# Patient Record
Sex: Female | Born: 2000 | Race: Black or African American | Hispanic: No | Marital: Single | Smoking: Never smoker
Health system: Southern US, Community
[De-identification: ages and names within clinical notes are randomized; demographics above are authoritative.]

## PROBLEM LIST (undated history)

## (undated) DIAGNOSIS — L309 Dermatitis, unspecified: Secondary | ICD-10-CM

## (undated) DIAGNOSIS — Z91018 Allergy to other foods: Secondary | ICD-10-CM

## (undated) DIAGNOSIS — T7840XA Allergy, unspecified, initial encounter: Secondary | ICD-10-CM

## (undated) DIAGNOSIS — G43909 Migraine, unspecified, not intractable, without status migrainosus: Secondary | ICD-10-CM

## (undated) DIAGNOSIS — R7303 Prediabetes: Secondary | ICD-10-CM

## (undated) DIAGNOSIS — J45909 Unspecified asthma, uncomplicated: Secondary | ICD-10-CM

## (undated) DIAGNOSIS — K219 Gastro-esophageal reflux disease without esophagitis: Secondary | ICD-10-CM

## (undated) DIAGNOSIS — Z91013 Allergy to seafood: Secondary | ICD-10-CM

## (undated) HISTORY — DX: Unspecified asthma, uncomplicated: J45.909

## (undated) HISTORY — DX: Dermatitis, unspecified: L30.9

## (undated) HISTORY — DX: Allergy to seafood: Z91.013

## (undated) HISTORY — DX: Allergy to other foods: Z91.018

## (undated) HISTORY — DX: Migraine, unspecified, not intractable, without status migrainosus: G43.909

## (undated) HISTORY — PX: NO PAST SURGERIES: SHX2092

## (undated) HISTORY — DX: Allergy, unspecified, initial encounter: T78.40XA

## (undated) HISTORY — DX: Gastro-esophageal reflux disease without esophagitis: K21.9

## (undated) HISTORY — DX: Prediabetes: R73.03

---
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
## 2022-01-08 ENCOUNTER — Ambulatory Visit: Payer: Medicaid Other | Admitting: Physician Assistant

## 2022-01-08 ENCOUNTER — Ambulatory Visit: Payer: Medicaid Other | Admitting: Family

## 2022-01-23 ENCOUNTER — Emergency Department (HOSPITAL_BASED_OUTPATIENT_CLINIC_OR_DEPARTMENT_OTHER)
Admission: EM | Admit: 2022-01-23 | Discharge: 2022-01-23 | Disposition: A | Discharge status: Home / Self Care | Payer: Medicaid Other | Attending: Emergency Medicine | Admitting: Emergency Medicine

## 2022-01-23 ENCOUNTER — Encounter (HOSPITAL_BASED_OUTPATIENT_CLINIC_OR_DEPARTMENT_OTHER): Payer: Self-pay | Admitting: Obstetrics and Gynecology

## 2022-01-23 ENCOUNTER — Other Ambulatory Visit: Payer: Self-pay

## 2022-01-23 DIAGNOSIS — M25531 Pain in right wrist: Secondary | ICD-10-CM | POA: Diagnosis not present

## 2022-01-23 DIAGNOSIS — Z7951 Long term (current) use of inhaled steroids: Secondary | ICD-10-CM | POA: Insufficient documentation

## 2022-01-23 DIAGNOSIS — J45909 Unspecified asthma, uncomplicated: Secondary | ICD-10-CM | POA: Insufficient documentation

## 2022-01-23 DIAGNOSIS — Y92410 Unspecified street and highway as the place of occurrence of the external cause: Secondary | ICD-10-CM | POA: Insufficient documentation

## 2022-01-23 DIAGNOSIS — S199XXA Unspecified injury of neck, initial encounter: Secondary | ICD-10-CM | POA: Diagnosis present

## 2022-01-23 DIAGNOSIS — S161XXA Strain of muscle, fascia and tendon at neck level, initial encounter: Secondary | ICD-10-CM | POA: Diagnosis not present
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
## 2022-01-23 NOTE — Discharge Instructions (Addendum)
You may use over-the-counter Motrin (Ibuprofen), Acetaminophen (Tylenol), topical muscle creams such as SalonPas, Icy Hot, Bengay, etc. Please stretch, apply ice or heat (whichever helps), and have massage therapy for additional assistance.  

## 2022-01-23 NOTE — ED Notes (Signed)
Late entry -- Pt arrived/triage/work-up/discharged during system downtime.

## 2022-02-12 ENCOUNTER — Ambulatory Visit (INDEPENDENT_AMBULATORY_CARE_PROVIDER_SITE_OTHER): Payer: Medicaid Other | Admitting: Nurse Practitioner

## 2022-02-12 ENCOUNTER — Encounter: Payer: Self-pay | Admitting: Nurse Practitioner

## 2022-02-12 VITALS — BP 118/90 | HR 98 | Temp 97.5°F | Resp 18 | Wt 225.8 lb

## 2022-02-12 DIAGNOSIS — N644 Mastodynia: Secondary | ICD-10-CM

## 2022-02-12 MED ORDER — NAPROXEN 500 MG PO TABS: 500.0000 mg | ORAL_TABLET | Freq: Two times a day (BID) | ORAL | 0 refills | Status: DC

## 2022-02-12 NOTE — Progress Notes (Signed)
Acute Office Visit  Subjective:     Patient ID: Jean Frank, female    DOB: May 15, 2001, 21 y.o.   MRN: [id number]  Chief Complaint  Patient presents with   Breast Problem    Pt c/o burning sensation in both breasts, pt states she feels it more in the Left breast than right    HPI Patient is in today for burning sensations in both breasts. She took some ibuprofen which didn't help. This has been going on for about 3 days. Denies injury. The burning sensation is on the top of her breast. She denies any bumps, changes in skin, nipple discharge, and fevers.   ROS See pertinent positives and negatives per HPI.     Objective:    BP 118/90   Pulse 98   Temp (!) 97.5 F (36.4 C) (Temporal)   Resp 18   Wt 225 lb 12.8 oz (102.4 kg)   LMP 01/08/2022 (Exact Date)   BMI 35.37 kg/m    Physical Exam Vitals and nursing note reviewed.  Constitutional:      General: She is not in acute distress.    Appearance: Normal appearance.  HENT:     Head: Normocephalic.  Eyes:     Conjunctiva/sclera: Conjunctivae normal.  Cardiovascular:     Rate and Rhythm: Normal rate and regular rhythm.     Pulses: Normal pulses.     Heart sounds: Normal heart sounds.  Pulmonary:     Effort: Pulmonary effort is normal.     Breath sounds: Normal breath sounds.  Chest:  Breasts:    Right: Normal. No inverted nipple, mass, nipple discharge or skin change.     Left: No inverted nipple, mass or nipple discharge.     Comments: Linear line of redness on top of left breast Musculoskeletal:     Cervical back: Normal range of motion.  Lymphadenopathy:     Upper Body:     Right upper body: No supraclavicular, axillary or pectoral adenopathy.     Left upper body: No supraclavicular, axillary or pectoral adenopathy.  Skin:    General: Skin is warm.  Neurological:     General: No focal deficit present.     Mental Status: She is alert and oriented to person, place, and time.  Psychiatric:         Mood and Affect: Mood normal.        Behavior: Behavior normal.        Thought Content: Thought content normal.        Judgment: Judgment normal.       Assessment & Plan:   Problem List Items Addressed This Visit       Other   Breast pain - Primary    She is having burning to the top of both breasts.  Breast exam was normal, no masses palpated.  On her left breast she does have a linear red line, however she is not sure if she scratched herself with her nail.  We will have her start naproxen 500 mg twice a day.  She can also use ice or heat to help with the pain.  She can sleep in a sports bra for compression. Encouraged her to send a MyChart message or call tomorrow if there is still a red wine or blisters on her left breast.  This could be the start of shingles.  Follow-up if symptoms worsen or with any concerns.       Meds ordered this encounter  Medications  naproxen (NAPROSYN) 500 MG tablet    Sig: Take 1 tablet (500 mg total) by mouth 2 (two) times daily with a meal.    Dispense:  30 tablet    Refill:  0    Return if symptoms worsen or fail to improve.  Gerre Scull, NP

## 2022-02-12 NOTE — Patient Instructions (Signed)
It was great to see you!  Start naproxen twice a day with food. Do not take with meloxicam. If your rash is still present on the left breast in the morning, let me know as it could be shingles versus whelp from scratching. You can also try some heat or ice and wear a sports bra at night.   Let's follow-up if your symptoms worsen or don't improve  Take care,  Rodman Pickle, NP

## 2022-02-12 NOTE — Assessment & Plan Note (Addendum)
She is having burning to the top of both breasts.  Breast exam was normal, no masses palpated.  On her left breast she does have a linear red line, however she is not sure if she scratched herself with her nail.  We will have her start naproxen 500 mg twice a day.  She can also use ice or heat to help with the pain.  She can sleep in a sports bra for compression. Encouraged her to send a MyChart message or call tomorrow if there is still a red wine or blisters on her left breast.  This could be the start of shingles.  Follow-up if symptoms worsen or with any concerns.

## 2022-03-03 IMAGING — CT CT HEAD W/O CM
4 of 5 series · 15 of 47 positions shown, 17 images · non-contrast
Comparison: None.

CLINICAL DATA: 18-year-old female with persistent headache for 5
days.

EXAM:
CT HEAD WITHOUT CONTRAST
TECHNIQUE: Contiguous axial images were obtained from the base of the skull
through the vertex without intravenous contrast.

[Series 3: coronal soft · coronal · 0.30mm/px · 3 of 64 slices shown]
[im 22/64  brain]
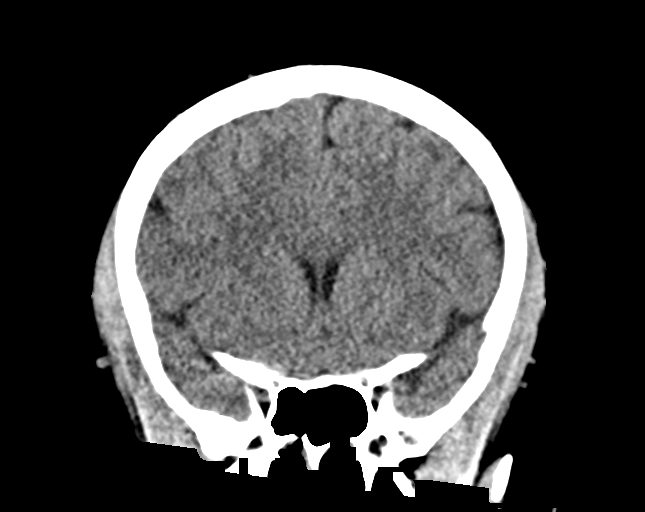
[im 29/64  brain]
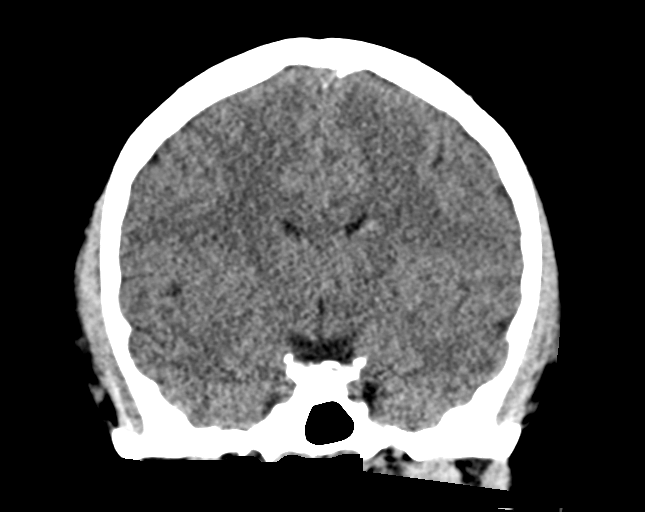
[im 36/64  brain]
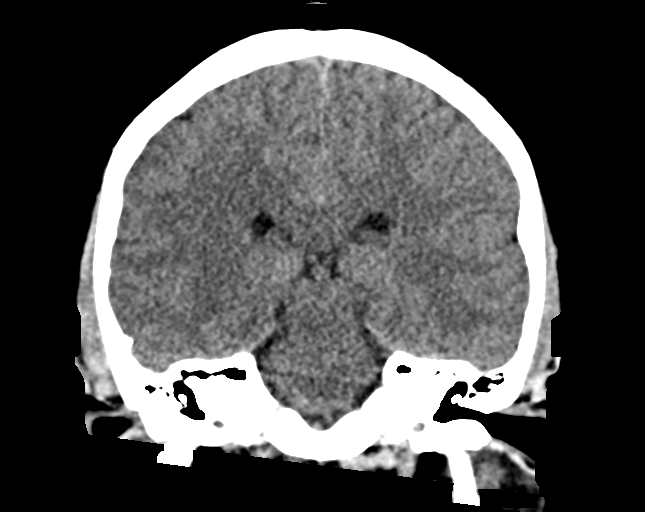

[Series 4: sag soft · sagittal · 0.30mm/px · 3 of 53 slices shown]
[im 18/53  brain]
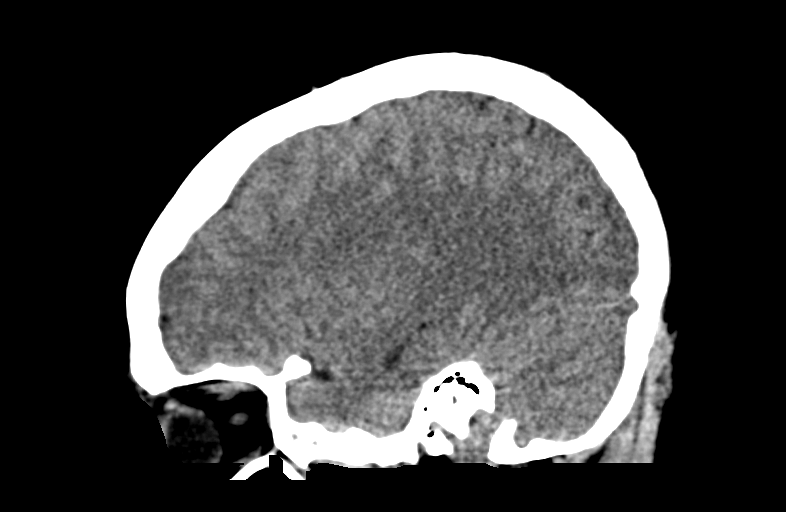
[im 27/53  brain]
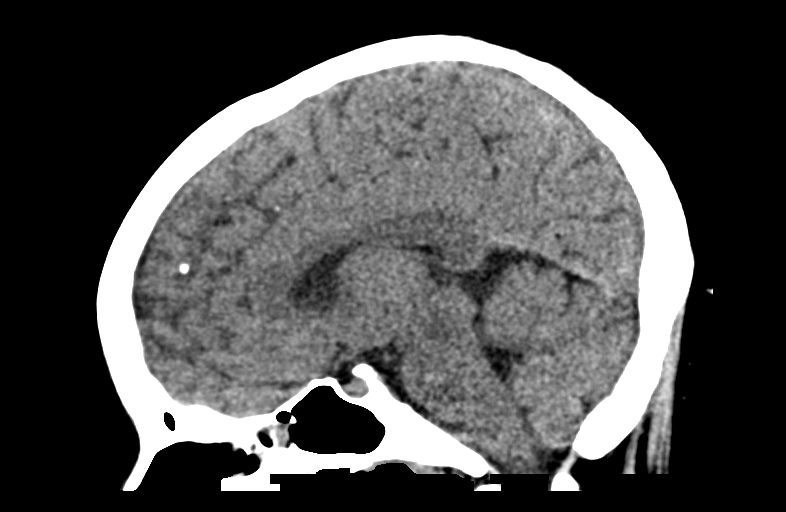
[im 35/53  brain]
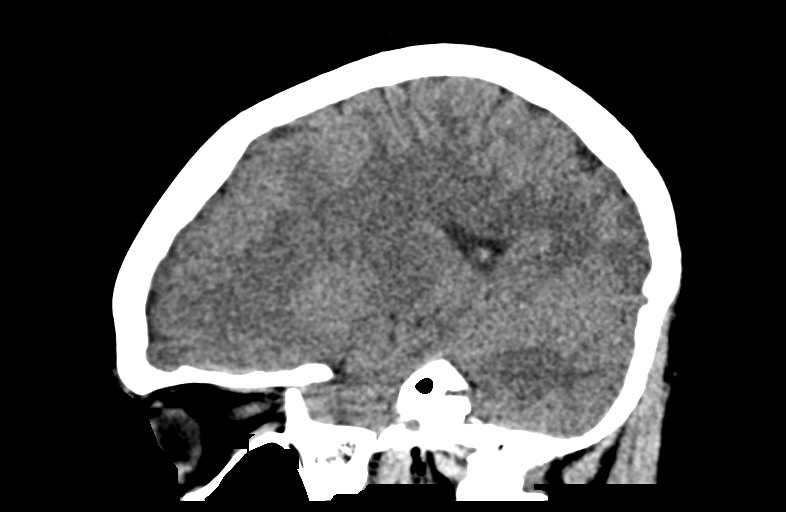

[Series 5: head bone · axial · 0.43mm/px · z∈[-179,-164]mm · 2 of 29 slices shown]
[im 3/29  bone]
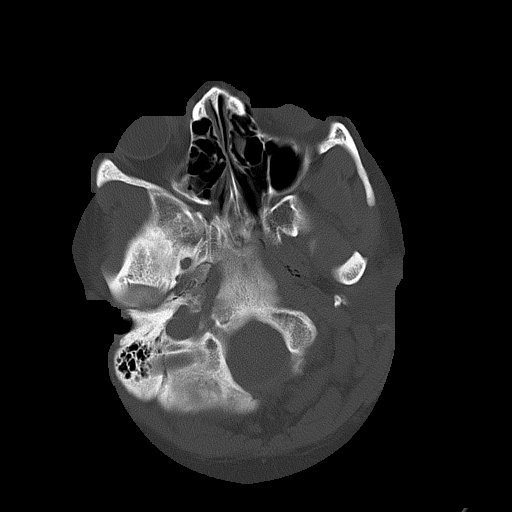
[im 6/29  bone]
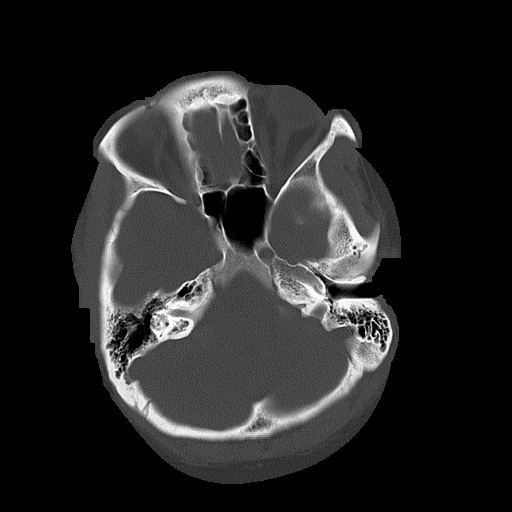

[Series 602: axial soft · axial · 0.43mm/px · z∈[-139,-50]mm · 7 of 25 slices shown, 9 images]
[im 4/25  brain]
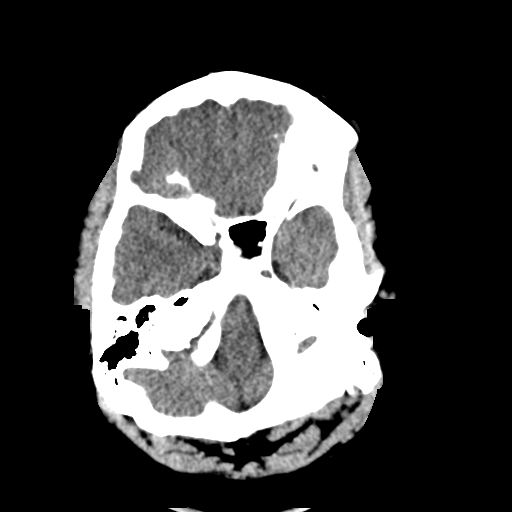
[im 4/25  bone]
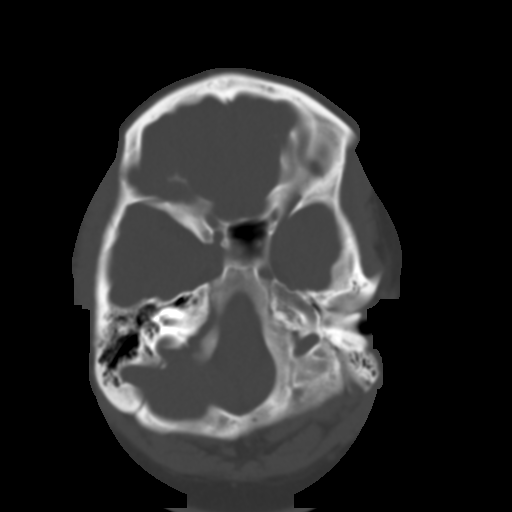
[im 7/25  brain]
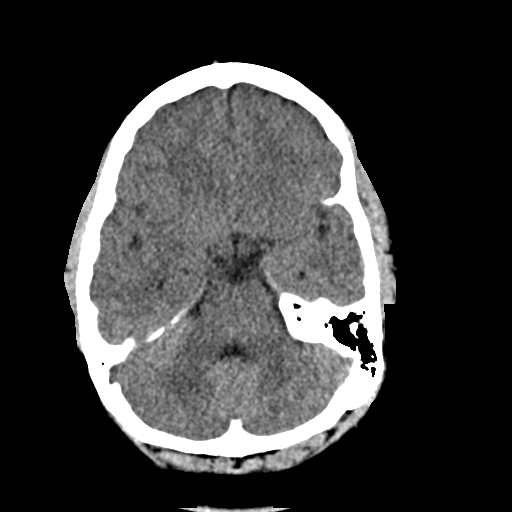
[im 10/25  brain]
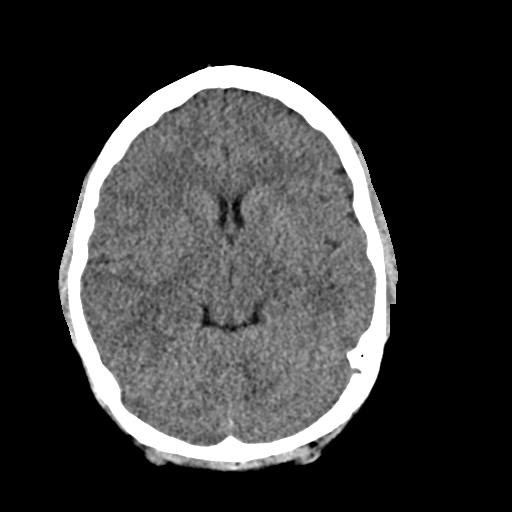
[im 13/25  brain]
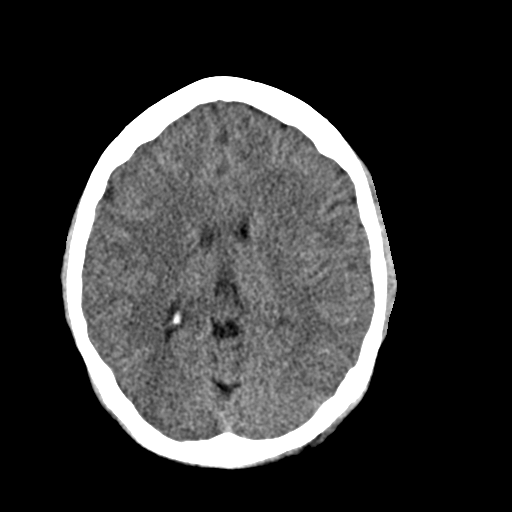
[im 16/25  brain]
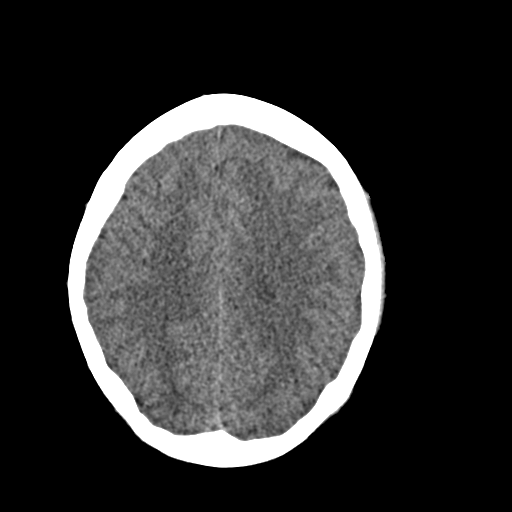
[im 16/25  bone]
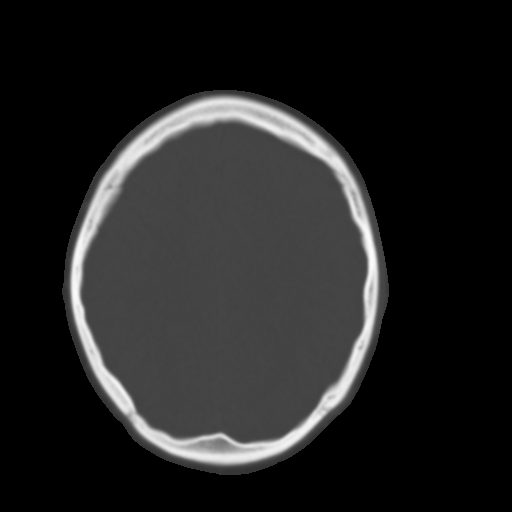
[im 19/25  brain]
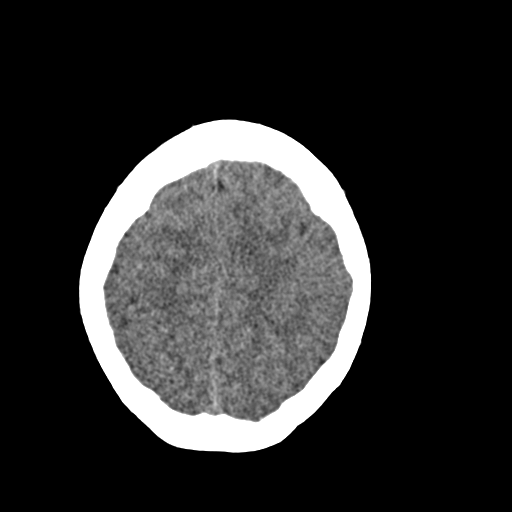
[im 22/25  brain]
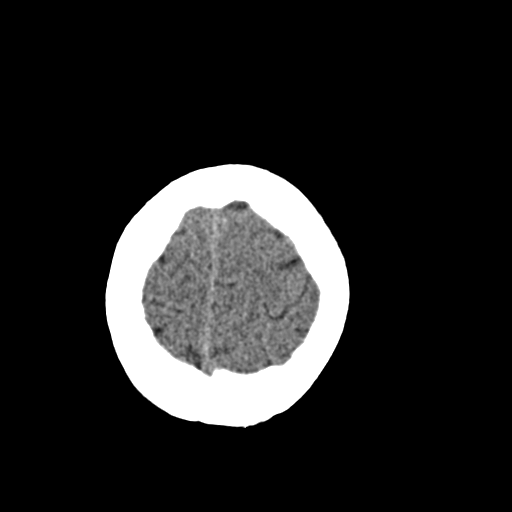

[15 of 47 positions shown; findings below may reference images not displayed]

FINDINGS: Brain: Normal cerebral volume. No midline shift, ventriculomegaly,
mass effect, evidence of mass lesion, intracranial hemorrhage or
evidence of cortically based acute infarction. Gray-white matter
differentiation is within normal limits throughout the brain.

Vascular: No suspicious intracranial vascular hyperdensity.

Skull: Negative.

Sinuses/Orbits: Minor paranasal sinus mucosal thickening, primarily
in the right ethmoids. Tympanic cavities and mastoids appear well
pneumatized.

Other: Visualized orbits and scalp soft tissues are within normal
limits.
IMPRESSION: 1. Normal noncontrast CT appearance of the brain.
2. Minimal paranasal sinus disease, significance doubtful.

## 2022-03-17 ENCOUNTER — Encounter (HOSPITAL_COMMUNITY): Payer: Self-pay | Admitting: Emergency Medicine

## 2022-03-17 ENCOUNTER — Emergency Department (HOSPITAL_COMMUNITY)
Admission: EM | Admit: 2022-03-17 | Discharge: 2022-03-17 | Disposition: A | Discharge status: Home / Self Care | Payer: Medicaid Other | Attending: Emergency Medicine | Admitting: Emergency Medicine

## 2022-03-17 ENCOUNTER — Emergency Department (HOSPITAL_COMMUNITY): Payer: Medicaid Other

## 2022-03-17 ENCOUNTER — Other Ambulatory Visit: Payer: Self-pay

## 2022-03-17 DIAGNOSIS — Z7951 Long term (current) use of inhaled steroids: Secondary | ICD-10-CM | POA: Diagnosis not present

## 2022-03-17 DIAGNOSIS — R079 Chest pain, unspecified: Secondary | ICD-10-CM

## 2022-03-17 DIAGNOSIS — J45909 Unspecified asthma, uncomplicated: Secondary | ICD-10-CM | POA: Insufficient documentation

## 2022-03-17 DIAGNOSIS — R000 Tachycardia, unspecified: Secondary | ICD-10-CM | POA: Insufficient documentation

## 2022-03-17 LAB — BASIC METABOLIC PANEL
Anion gap: 7 (ref 5–15)
BUN: 9 mg/dL (ref 6–20)
CO2: 24 mmol/L (ref 22–32)
Calcium: 9.4 mg/dL (ref 8.9–10.3)
Chloride: 108 mmol/L (ref 98–111)
Creatinine, Ser: 0.71 mg/dL (ref 0.44–1.00)
GFR, Estimated: >60 mL/min (ref >60)
Glucose, Bld: 96 mg/dL (ref 70–99)
Potassium: 4.1 mmol/L (ref 3.5–5.1)
Sodium: 139 mmol/L (ref 135–145)

## 2022-03-17 LAB — TROPONIN I (HIGH SENSITIVITY)
Troponin I (High Sensitivity): 4 ng/L (ref <18)
Troponin I (High Sensitivity): <2 ng/L (ref <18)

## 2022-03-17 LAB — CBC
HCT: 34 % — ABNORMAL LOW (ref 36.0–46.0)
Hemoglobin: 10.1 g/dL — ABNORMAL LOW (ref 12.0–15.0)
MCH: 22 pg — ABNORMAL LOW (ref 26.0–34.0)
MCHC: 29.7 g/dL — ABNORMAL LOW (ref 30.0–36.0)
MCV: 74.1 fL — ABNORMAL LOW (ref 80.0–100.0)
Platelets: 319 10*3/uL (ref 150–400)
RBC: 4.59 MIL/uL (ref 3.87–5.11)
RDW: 16.3 % — ABNORMAL HIGH (ref 11.5–15.5)
WBC: 6.2 10*3/uL (ref 4.0–10.5)
nRBC: 0 % (ref 0.0–0.2)

## 2022-03-17 NOTE — ED Provider Triage Note (Signed)
Emergency Medicine Provider Triage Evaluation Note  Jean Frank , a 21 y.o. female  was evaluated in triage.  Pt complains of left-sided chest pain since 11 PM yesterday.  Patient states she had a bad headache late in the day yesterday but was able to go to sleep.  She was awakened from sleep with sharp chest pain at 11 PM.  She took ibuprofen and was able to go to sleep at 2 AM.  She states she had was 6:30 AM to go to work and was having continued chest pain and began to have lightheadedness upon standing.  She denies any nausea, vomiting, abdominal pain, shortness of breath.  Patient states she has been having issues with her blood pressure and is currently being evaluated by primary care for hypertension  Review of Systems  Positive: Chest pain Negative: Shortness of breath, abdominal pain, nausea  Physical Exam  BP 137/81 (BP Location: Right Arm)   Pulse 96   Temp 98.8 F (37.1 C) (Oral)   Resp 16   SpO2 100%  Gen:   Awake, no distress   Resp:  Normal effort  MSK:   Moves extremities without difficulty  Other:    Medical Decision Making  Medically screening exam initiated at 11:13 AM.  Appropriate orders placed.  Jean Frank was informed that the remainder of the evaluation will be completed by another provider, this initial triage assessment does not replace that evaluation, and the importance of remaining in the ED until their evaluation is complete.     Darrick Grinder, PA-C 03/17/22 1114

## 2022-03-17 NOTE — ED Notes (Signed)
Patient requested iv out stating she wants to go home, then decided to stay

## 2022-03-17 NOTE — ED Provider Notes (Signed)
[facility] EMERGENCY DEPARTMENT Provider Note   CSN: [id number] Arrival date & time: 03/17/22  1102     History {Add pertinent medical, surgical, social history, OB history to HPI:1} Chief Complaint  Patient presents with   Chest Pain    Jean Frank is a 21 y.o. female with past medical history of asthma and anxiety who presents to the emergency room today with chest pain.  Patient states that her chest pain began at approximately 11 PM last night and she had gone to sleep without difficulty.  She had awoken this morning and was speaking to her mom and her telephone who thought that she had slurred speech.  The patient's mother subsequently called EMS given the patient slurred speech and persistent low-grade chest pain.  Upon time evaluation, the patient states that her slurred speech has completely resolved.  She is with her significant other who confirms that the patient is mentating at baseline and speaking as she normally does.  Patient states that she did have an episode of transient lightheadedness which she attributes to a panic attack.  She states she has had panic attacks in the past.  Today with slightly different given that it was associated with chest pain.  Her chest pain is nonpleuritic, the patient has never had a DVT, the patient has no family history of DVT or sudden cardiac death.  Patient does not take any estrogen-containing therapy.   Chest Pain Associated symptoms: no abdominal pain, no back pain, no cough, no fever, no palpitations, no shortness of breath and no vomiting        Home Medications Prior to Admission medications   Medication Sig Start Date End Date Taking? Authorizing Provider  albuterol (PROVENTIL HFA) 108 (90 Base) MCG/ACT inhaler Inhale 2 puffs into the lungs every 4 (four) hours as needed for wheezing or shortness of breath. 06/21/21   Hetty Blend, FNP  cetirizine (ZYRTEC) 10 MG tablet Take 1 tablet (10 mg total) by mouth 2  (two) times daily. Take one tablet once or twice a day for runny nose or itching 06/21/21   Ambs, Norvel Richards, FNP  EPIPEN 2-PAK 0.3 MG/0.3ML SOAJ injection Inject 0.3 mg into the muscle as needed for anaphylaxis. 06/21/21   Hetty Blend, FNP  famotidine (PEPCID) 20 MG tablet Take 1 tablet (20 mg total) by mouth 2 (two) times daily. 06/21/21   Hetty Blend, FNP  fluticasone (FLONASE) 50 MCG/ACT nasal spray Place 1-2 sprays into both nostrils daily. 06/21/21   Hetty Blend, FNP  hydrocortisone 2.5 % cream Apply topically 2 (two) times daily. 06/21/21   Hetty Blend, FNP  meloxicam (MOBIC) 15 MG tablet Take 1 tablet (15 mg total) by mouth daily. Patient not taking: Reported on 02/12/2022 12/18/21   McElwee, Leotis Shames A, NP  montelukast (SINGULAIR) 10 MG tablet Take 1 tablet (10 mg total) by mouth at bedtime. 06/21/21   Hetty Blend, FNP  naproxen (NAPROSYN) 500 MG tablet Take 1 tablet (500 mg total) by mouth 2 (two) times daily with a meal. 02/12/22   McElwee, Lauren A, NP  triamcinolone ointment (KENALOG) 0.1 % Apply 1 application topically 2 (two) times daily. 06/21/21   Hetty Blend, FNP      Allergies    Shellfish allergy    Review of Systems   Review of Systems  Constitutional:  Negative for chills and fever.  HENT:  Negative for ear pain and sore throat.   Eyes:  Negative  for pain and visual disturbance.  Respiratory:  Negative for cough and shortness of breath.   Cardiovascular:  Positive for chest pain. Negative for palpitations.  Gastrointestinal:  Negative for abdominal pain and vomiting.  Genitourinary:  Negative for dysuria and hematuria.  Musculoskeletal:  Negative for arthralgias and back pain.  Skin:  Negative for color change and rash.  Neurological:  Negative for seizures and syncope.  Psychiatric/Behavioral:  The patient is nervous/anxious.   All other systems reviewed and are negative.   Physical Exam Updated Vital Signs BP (!) 148/93   Pulse 98   Temp 98 F (36.7 C) (Oral)   Resp  16   LMP 03/17/2022 (Exact Date)   SpO2 100%  Physical Exam Vitals and nursing note reviewed.  Constitutional:      General: She is not in acute distress.    Appearance: She is well-developed.     Comments: Upon entering the exam room, patient is lying in bed awake alert no acute distress  HENT:     Head: Normocephalic and atraumatic.  Eyes:     Conjunctiva/sclera: Conjunctivae normal.  Cardiovascular:     Rate and Rhythm: Normal rate and regular rhythm.     Heart sounds: No murmur heard. Pulmonary:     Effort: Pulmonary effort is normal. No respiratory distress.     Breath sounds: Normal breath sounds.  Abdominal:     Palpations: Abdomen is soft.     Tenderness: There is no abdominal tenderness.  Musculoskeletal:        General: No swelling.     Cervical back: Neck supple.  Skin:    General: Skin is warm and dry.     Capillary Refill: Capillary refill takes less than 2 seconds.  Neurological:     Mental Status: She is alert.  Psychiatric:        Mood and Affect: Mood normal.     ED Results / Procedures / Treatments   Labs (all labs ordered are listed, but only abnormal results are displayed) Labs Reviewed  CBC - Abnormal; Notable for the following components:      Result Value   Hemoglobin 10.1 (*)    HCT 34.0 (*)    MCV 74.1 (*)    MCH 22.0 (*)    MCHC 29.7 (*)    RDW 16.3 (*)    All other components within normal limits  BASIC METABOLIC PANEL  TROPONIN I (HIGH SENSITIVITY)  TROPONIN I (HIGH SENSITIVITY)    EKG EKG Interpretation  Date/Time:  Monday March 17 2022 11:11:06 EDT Ventricular Rate:  82 PR Interval:  122 QRS Duration: 80 QT Interval:  348 QTC Calculation: 406 R Axis:   61 Text Interpretation: Normal sinus rhythm Normal ECG Artifact No previous ECGs available Confirmed by Gerhard Munch [phone]) on 03/17/2022 5:04:15 PM  Radiology DG Chest 1 View  Result Date: 03/17/2022 CLINICAL DATA:  Mid chest pain radiating into the left side since  yesterday. History of asthma. EXAM: CHEST  1 VIEW COMPARISON:  Radiographs 07/22/2017 and 03/29/2017. FINDINGS: 1124 hours. Lordotic positioning. The heart size and mediastinal contours are normal. The lungs are clear. There is no pleural effusion or pneumothorax. No acute osseous findings are identified. IMPRESSION: No evidence of active cardiopulmonary process. Electronically Signed   By: Carey Bullocks M.D.   On: 03/17/2022 11:31    Procedures Procedures  {Document cardiac monitor, telemetry assessment procedure when appropriate:1}  Medications Ordered in ED Medications - No data to display  ED Course/  Medical Decision Making/ A&P                           Medical Decision Making Amount and/or Complexity of Data Reviewed Labs: ordered.   ***  {Document critical care time when appropriate:1} {Document review of labs and clinical decision tools ie heart score, Chads2Vasc2 etc:1}  {Document your independent review of radiology images, and any outside records:1} {Document your discussion with family members, caretakers, and with consultants:1} {Document social determinants of health affecting pt's care:1} {Document your decision making why or why not admission, treatments were needed:1} Final Clinical Impression(s) / ED Diagnoses Final diagnoses:  None    Rx / DC Orders ED Discharge Orders     None

## 2022-03-17 NOTE — ED Triage Notes (Signed)
Pt arrives via EMS- went to bed with a headache. Woke up at 11 pm with chest pain. Pt stood up to go to work this morning, felt dizzy, having chest pain still. EMS arrives, EKG showed ST 120's. BP 180/102. Pt's MD has been monitoring BP as it has been running high. Denies n/v, SOB. CBG 95 EMS gave 324mg  ASA.

## 2022-03-17 NOTE — ED Notes (Signed)
Arrived to draw blood and pt was not in bed

## 2022-03-18 NOTE — Progress Notes (Unsigned)
   Established Patient Office Visit  Subjective   Patient ID: Prabhleen Montemayor, female    DOB: 05/21/2001  Age: 21 y.o. MRN: [id number]  No chief complaint on file.   HPI  Ezrie Bunyan is here to follow-up on chronic thoracic back pain. She was started on meloxicam daily and referred to orthopedics. She had an appointment scheduled, however did not go.   {History (Optional):23778}  ROS    Objective:     LMP 03/17/2022 (Exact Date)  {Vitals History (Optional):23777}  Physical Exam   No results found for any visits on 03/19/22.  {Labs (Optional):23779}  The ASCVD Risk score (Arnett DK, et al., 2019) failed to calculate for the following reasons:   The 2019 ASCVD risk score is only valid for ages 20 to 49    Assessment & Plan:   Problem List Items Addressed This Visit   None   No follow-ups on file.    Gerre Scull, NP

## 2022-03-19 ENCOUNTER — Ambulatory Visit (INDEPENDENT_AMBULATORY_CARE_PROVIDER_SITE_OTHER): Payer: Medicaid Other | Admitting: Nurse Practitioner

## 2022-03-19 ENCOUNTER — Encounter: Payer: Self-pay | Admitting: Nurse Practitioner

## 2022-03-19 VITALS — BP 130/84 | HR 95 | Temp 97.4°F | Wt 228.0 lb

## 2022-03-19 DIAGNOSIS — G8929 Other chronic pain: Secondary | ICD-10-CM

## 2022-03-19 DIAGNOSIS — N3001 Acute cystitis with hematuria: Secondary | ICD-10-CM | POA: Diagnosis not present

## 2022-03-19 DIAGNOSIS — R079 Chest pain, unspecified: Secondary | ICD-10-CM

## 2022-03-19 DIAGNOSIS — R300 Dysuria: Secondary | ICD-10-CM

## 2022-03-19 DIAGNOSIS — D649 Anemia, unspecified: Secondary | ICD-10-CM | POA: Diagnosis not present

## 2022-03-19 DIAGNOSIS — M546 Pain in thoracic spine: Secondary | ICD-10-CM

## 2022-03-19 LAB — CBC
HCT: 33.1 % — ABNORMAL LOW (ref 36.0–49.0)
Hemoglobin: 10.2 g/dL — ABNORMAL LOW (ref 12.0–16.0)
MCHC: 30.8 g/dL — ABNORMAL LOW (ref 31.0–37.0)
MCV: 70.2 fl — ABNORMAL LOW (ref 78.0–98.0)
Platelets: 315 10*3/uL (ref 150.0–575.0)
RBC: 4.71 Mil/uL (ref 3.80–5.70)
RDW: 17.2 % — ABNORMAL HIGH (ref 11.4–15.5)
WBC: 4.6 10*3/uL (ref 4.5–13.5)

## 2022-03-19 LAB — POCT URINALYSIS DIPSTICK
Bilirubin, UA: NEGATIVE
Glucose, UA: NEGATIVE
Ketones, UA: NEGATIVE
Nitrite, UA: NEGATIVE
Protein, UA: POSITIVE — AB
Spec Grav, UA: 1.01 (ref 1.010–1.025)
Urobilinogen, UA: 0.2 E.U./dL
pH, UA: 8.5 — AB (ref 5.0–8.0)

## 2022-03-19 MED ORDER — MELOXICAM 15 MG PO TABS: 15.0000 mg | ORAL_TABLET | Freq: Every day | ORAL | 1 refills | Status: DC

## 2022-03-19 MED ORDER — NITROFURANTOIN MONOHYD MACRO 100 MG PO CAPS: 100.0000 mg | ORAL_CAPSULE | Freq: Two times a day (BID) | ORAL | 0 refills | Status: DC

## 2022-03-19 NOTE — Assessment & Plan Note (Signed)
Her hemoglobin was 10.1 on 03/17/2022 at the ER.  She is taking an iron supplement daily.  We will recheck CBC and iron panel today.

## 2022-03-19 NOTE — Patient Instructions (Signed)
It was great to see you!  Continue meloxicam 1 tablet daily with food for your chest and side pain. Keep your appointment with orthopedics.   We are checking your labs today and will let you know the results via mychart/phone.   Let's follow-up in 4 weeks, sooner if you have concerns.  If a referral was placed today, you will be contacted for an appointment. Please note that routine referrals can sometimes take up to 3-4 weeks to process. Please call our office if you haven't heard anything after this time frame.  Take care,  Rodman Pickle, NP

## 2022-03-19 NOTE — Assessment & Plan Note (Signed)
She states that the back pain has kind of resolved but it is still long her lateral sides.  She does have tenderness to palpation.  Continue meloxicam 15 mg daily as needed.  She has an appointment with orthopedics at the end of this month.

## 2022-03-20 LAB — IRON,TIBC AND FERRITIN PANEL
%SAT: 5 % (calc) — ABNORMAL LOW (ref 15–45)
Ferritin: 13 ng/mL (ref 6–67)
Iron: 23 ug/dL — ABNORMAL LOW (ref 27–164)
TIBC: 451 mcg/dL (calc) — ABNORMAL HIGH (ref 271–448)

## 2022-03-22 LAB — URINE CULTURE
MICRO NUMBER:: 13732085
SPECIMEN QUALITY:: ADEQUATE

## 2022-03-26 ENCOUNTER — Encounter: Payer: Self-pay | Admitting: Nurse Practitioner

## 2022-03-26 MED ORDER — IRON (FERROUS SULFATE) 325 (65 FE) MG PO TABS: 325.0000 mg | ORAL_TABLET | Freq: Every day | ORAL | 1 refills | Status: DC

## 2022-04-10 ENCOUNTER — Encounter: Payer: Self-pay | Admitting: Nurse Practitioner

## 2022-04-15 NOTE — Progress Notes (Unsigned)
   Acute Office Visit  Subjective:     Patient ID: Jean Frank, female    DOB: Apr 29, 2001, 21 y.o.   MRN: [id number]  No chief complaint on file.   HPI Patient is in today for ***  ROS      Objective:    LMP 03/17/2022 (Exact Date)  {Vitals History (Optional):23777}  Physical Exam  No results found for any visits on 04/16/22.      Assessment & Plan:   Problem List Items Addressed This Visit   None   No orders of the defined types were placed in this encounter.   No follow-ups on file.  Gerre Scull, NP

## 2022-04-16 ENCOUNTER — Ambulatory Visit (INDEPENDENT_AMBULATORY_CARE_PROVIDER_SITE_OTHER): Payer: Medicaid Other | Admitting: Nurse Practitioner

## 2022-04-16 ENCOUNTER — Encounter: Payer: Self-pay | Admitting: Nurse Practitioner

## 2022-04-16 ENCOUNTER — Other Ambulatory Visit (HOSPITAL_COMMUNITY)
Admission: RE | Admit: 2022-04-16 | Discharge: 2022-04-16 | Disposition: A | Discharge status: Home / Self Care | Payer: Medicaid Other | Source: Ambulatory Visit | Attending: Nurse Practitioner | Admitting: Nurse Practitioner

## 2022-04-16 VITALS — BP 142/78 | HR 103 | Temp 97.3°F | Wt 228.4 lb

## 2022-04-16 DIAGNOSIS — N898 Other specified noninflammatory disorders of vagina: Secondary | ICD-10-CM | POA: Insufficient documentation

## 2022-04-16 DIAGNOSIS — R030 Elevated blood-pressure reading, without diagnosis of hypertension: Secondary | ICD-10-CM | POA: Diagnosis not present

## 2022-04-16 MED ORDER — FLUCONAZOLE 150 MG PO TABS: ORAL_TABLET | ORAL | 0 refills | Status: DC

## 2022-04-16 NOTE — Patient Instructions (Signed)
It was great to see you!  Start 1 diflucan tablet today, then if still having symptoms take another tablet in 3 days.   We will let you know the results of your urine.   Start checking your blood pressure at home and writing it down.   Let's follow-up in 3 months, sooner if you have concerns.  If a referral was placed today, you will be contacted for an appointment. Please note that routine referrals can sometimes take up to 3-4 weeks to process. Please call our office if you haven't heard anything after this time frame.  Take care,  Rodman Pickle, NP

## 2022-04-16 NOTE — Assessment & Plan Note (Signed)
Blood pressure is elevated at 142/78. Encouraged her to continue checking her blood pressure at home. Limit salt in her diet and increase exercise. Follow-up in 3 months.

## 2022-04-17 ENCOUNTER — Encounter: Payer: Self-pay | Admitting: Nurse Practitioner

## 2022-04-18 LAB — URINE CYTOLOGY ANCILLARY ONLY
Bacterial Vaginitis-Urine: NEGATIVE
Candida Urine: NEGATIVE
Chlamydia: NEGATIVE
Comment: NEGATIVE
Comment: NORMAL
Neisseria Gonorrhea: NEGATIVE

## 2022-06-09 ENCOUNTER — Emergency Department (HOSPITAL_BASED_OUTPATIENT_CLINIC_OR_DEPARTMENT_OTHER)
Admission: EM | Admit: 2022-06-09 | Discharge: 2022-06-09 | Discharge status: Against Medical Advice | Payer: Medicaid Other | Attending: Emergency Medicine | Admitting: Emergency Medicine

## 2022-06-09 ENCOUNTER — Encounter (HOSPITAL_BASED_OUTPATIENT_CLINIC_OR_DEPARTMENT_OTHER): Payer: Self-pay | Admitting: Emergency Medicine

## 2022-06-09 ENCOUNTER — Other Ambulatory Visit: Payer: Self-pay

## 2022-06-09 DIAGNOSIS — Z5321 Procedure and treatment not carried out due to patient leaving prior to being seen by health care provider: Secondary | ICD-10-CM | POA: Insufficient documentation

## 2022-06-09 DIAGNOSIS — G43909 Migraine, unspecified, not intractable, without status migrainosus: Secondary | ICD-10-CM | POA: Insufficient documentation

## 2022-06-09 NOTE — ED Triage Notes (Signed)
Migraine headache, frontal, light sensitivity  Started 2 days ago. Unable to sleep. Not relieved with ibuprofen History of migraine.

## 2022-06-09 NOTE — ED Notes (Signed)
No answer when called times 3.

## 2022-06-11 ENCOUNTER — Encounter (HOSPITAL_BASED_OUTPATIENT_CLINIC_OR_DEPARTMENT_OTHER): Payer: Self-pay

## 2022-06-11 ENCOUNTER — Other Ambulatory Visit: Payer: Self-pay

## 2022-06-11 ENCOUNTER — Emergency Department (HOSPITAL_BASED_OUTPATIENT_CLINIC_OR_DEPARTMENT_OTHER)
Admission: EM | Admit: 2022-06-11 | Discharge: 2022-06-12 | Disposition: A | Discharge status: Home / Self Care | Payer: Self-pay | Attending: Emergency Medicine | Admitting: Emergency Medicine

## 2022-06-11 DIAGNOSIS — H538 Other visual disturbances: Secondary | ICD-10-CM | POA: Insufficient documentation

## 2022-06-11 DIAGNOSIS — R519 Headache, unspecified: Secondary | ICD-10-CM | POA: Insufficient documentation

## 2022-06-11 DIAGNOSIS — R739 Hyperglycemia, unspecified: Secondary | ICD-10-CM | POA: Insufficient documentation

## 2022-06-11 DIAGNOSIS — R1110 Vomiting, unspecified: Secondary | ICD-10-CM | POA: Insufficient documentation

## 2022-06-11 DIAGNOSIS — D649 Anemia, unspecified: Secondary | ICD-10-CM | POA: Insufficient documentation

## 2022-06-11 LAB — CBC WITH DIFFERENTIAL/PLATELET
Abs Immature Granulocytes: 0.02 10*3/uL (ref 0.00–0.07)
Basophils Absolute: 0 10*3/uL (ref 0.0–0.1)
Basophils Relative: 0 %
Eosinophils Absolute: 0.5 10*3/uL (ref 0.0–0.5)
Eosinophils Relative: 6 %
HCT: 33.6 % — ABNORMAL LOW (ref 36.0–46.0)
Hemoglobin: 9.8 g/dL — ABNORMAL LOW (ref 12.0–15.0)
Immature Granulocytes: 0 %
Lymphocytes Relative: 32 %
Lymphs Abs: 2.9 10*3/uL (ref 0.7–4.0)
MCH: 21 pg — ABNORMAL LOW (ref 26.0–34.0)
MCHC: 29.2 g/dL — ABNORMAL LOW (ref 30.0–36.0)
MCV: 71.9 fL — ABNORMAL LOW (ref 80.0–100.0)
Monocytes Absolute: 0.6 10*3/uL (ref 0.1–1.0)
Monocytes Relative: 7 %
Neutro Abs: 4.9 10*3/uL (ref 1.7–7.7)
Neutrophils Relative %: 55 %
Platelets: 405 10*3/uL — ABNORMAL HIGH (ref 150–400)
RBC: 4.67 MIL/uL (ref 3.87–5.11)
RDW: 16.2 % — ABNORMAL HIGH (ref 11.5–15.5)
WBC: 8.9 10*3/uL (ref 4.0–10.5)
nRBC: 0 % (ref 0.0–0.2)

## 2022-06-11 LAB — COMPREHENSIVE METABOLIC PANEL
ALT: 5 U/L (ref 0–44)
AST: 11 U/L — ABNORMAL LOW (ref 15–41)
Albumin: 4.6 g/dL (ref 3.5–5.0)
Alkaline Phosphatase: 58 U/L (ref 38–126)
Anion gap: 10 (ref 5–15)
BUN: 10 mg/dL (ref 6–20)
CO2: 25 mmol/L (ref 22–32)
Calcium: 9.7 mg/dL (ref 8.9–10.3)
Chloride: 100 mmol/L (ref 98–111)
Creatinine, Ser: 0.66 mg/dL (ref 0.44–1.00)
GFR, Estimated: >60 mL/min (ref >60)
Glucose, Bld: 134 mg/dL — ABNORMAL HIGH (ref 70–99)
Potassium: 3.7 mmol/L (ref 3.5–5.1)
Sodium: 135 mmol/L (ref 135–145)
Total Bilirubin: 0.2 mg/dL — ABNORMAL LOW (ref 0.3–1.2)
Total Protein: 7.6 g/dL (ref 6.5–8.1)

## 2022-06-11 LAB — CBG MONITORING, ED: Glucose-Capillary: 135 mg/dL — ABNORMAL HIGH (ref 70–99)

## 2022-06-11 LAB — HCG, SERUM, QUALITATIVE: Preg, Serum: NEGATIVE

## 2022-06-11 MED ORDER — METOCLOPRAMIDE HCL 5 MG/ML IJ SOLN
10.0000 mg | Freq: Once | INTRAMUSCULAR | Status: AC
Start: 2022-06-11 — End: 2022-06-11
  Administered 2022-06-11: 10 mg via INTRAVENOUS
  Filled 2022-06-11: qty 2

## 2022-06-11 MED ORDER — KETOROLAC TROMETHAMINE 15 MG/ML IJ SOLN
15.0000 mg | Freq: Once | INTRAMUSCULAR | Status: AC
  Administered 2022-06-11: 15 mg via INTRAVENOUS
  Filled 2022-06-11: qty 1

## 2022-06-11 MED ORDER — SODIUM CHLORIDE 0.9 % IV BOLUS
1000.0000 mL | Freq: Once | INTRAVENOUS | Status: AC
  Administered 2022-06-11: 1000 mL via INTRAVENOUS

## 2022-06-11 MED ORDER — DIPHENHYDRAMINE HCL 50 MG/ML IJ SOLN
25.0000 mg | Freq: Once | INTRAMUSCULAR | Status: AC
  Administered 2022-06-11: 25 mg via INTRAVENOUS
  Filled 2022-06-11: qty 1

## 2022-06-11 NOTE — ED Triage Notes (Signed)
Patient here Pov from Home.  Endorses Migraine for approximately 4-5 Days.   Some Nausea. No Emesis. Some Blurry Vision Recently. Some Photosensitivity.   NAD Noted during Triage. A&Ox4. GCS 15. Ambulatory.

## 2022-06-11 NOTE — ED Notes (Signed)
Pt decided to let her friend drive her home and she will return later today to get her car

## 2022-06-11 NOTE — Discharge Instructions (Addendum)
If you develop continued, recurrent, or worsening headache, fever, neck stiffness, vomiting, blurry or double vision, weakness or numbness in your arms or legs, trouble speaking, or any other new/concerning symptoms then return to the ER for evaluation.  

## 2022-06-11 NOTE — ED Provider Notes (Signed)
[facility] EMERGENCY DEPT Provider Note   CSN: [id number] Arrival date & time: 06/11/22  1826     History  Chief Complaint  Patient presents with   Headache    Jean Frank is a 21 y.o. female.  HPI 21 year old female presents with a headache.  Ongoing for about 5 days.  It is diffuse but primarily there is a burning in the forehead.  Some light sensitivity and today her headache caused her to have some blurry vision bilaterally.  When the lights went out, her vision seem to go back to normal.  When I turned on the lights in the room she felt like the blurry vision was coming back.  Some vomiting.  No neck stiffness.  No weakness or numbness in the extremities.  No fevers.  She has a history of migraines though those were typically more occipital.  The headache started 5 days ago and was pretty mild but has gradually worsened.  She is taken ibuprofen, Motrin, Excedrin.  Nothing is seem to help. No trauma.  Home Medications Prior to Admission medications   Medication Sig Start Date End Date Taking? Authorizing Provider  albuterol (PROVENTIL HFA) 108 (90 Base) MCG/ACT inhaler Inhale 2 puffs into the lungs every 4 (four) hours as needed for wheezing or shortness of breath. 06/21/21   Hetty Blend, FNP  cetirizine (ZYRTEC) 10 MG tablet Take 1 tablet (10 mg total) by mouth 2 (two) times daily. Take one tablet once or twice a day for runny nose or itching 06/21/21   Ambs, Norvel Richards, FNP  EPIPEN 2-PAK 0.3 MG/0.3ML SOAJ injection Inject 0.3 mg into the muscle as needed for anaphylaxis. 06/21/21   Hetty Blend, FNP  famotidine (PEPCID) 20 MG tablet Take 1 tablet (20 mg total) by mouth 2 (two) times daily. 06/21/21   Hetty Blend, FNP  fluconazole (DIFLUCAN) 150 MG tablet Take 1 tablet today. Then you can take a second tablet in 3 days if you are still having symptoms. 04/16/22   McElwee, Lauren A, NP  fluticasone (FLONASE) 50 MCG/ACT nasal spray Place 1-2 sprays into both nostrils  daily. 06/21/21   Hetty Blend, FNP  hydrocortisone 2.5 % cream Apply topically 2 (two) times daily. 06/21/21   Hetty Blend, FNP  Iron, Ferrous Sulfate, 325 (65 Fe) MG TABS Take 325 mg by mouth daily. 03/26/22   McElwee, Lauren A, NP  meloxicam (MOBIC) 15 MG tablet Take 1 tablet (15 mg total) by mouth daily. 03/19/22   McElwee, Lauren A, NP  montelukast (SINGULAIR) 10 MG tablet Take 1 tablet (10 mg total) by mouth at bedtime. 06/21/21   Ambs, Norvel Richards, FNP  nitrofurantoin, macrocrystal-monohydrate, (MACROBID) 100 MG capsule Take 1 capsule (100 mg total) by mouth 2 (two) times daily. 03/19/22   McElwee, Jake Church, NP      Allergies    Shellfish allergy    Review of Systems   Review of Systems  Constitutional:  Negative for fever.  Eyes:  Positive for photophobia and visual disturbance.  Gastrointestinal:  Positive for nausea and vomiting.  Musculoskeletal:  Negative for neck stiffness.  Neurological:  Positive for headaches. Negative for weakness.    Physical Exam Updated Vital Signs BP (!) 131/110   Pulse 97   Temp 97.9 F (36.6 C)   Resp 18   Ht 5\' 7"  (1.702 m)   Wt 103.6 kg   LMP 06/01/2022   SpO2 100%   BMI 35.77 kg/m  Physical Exam Vitals  and nursing note reviewed.  Constitutional:      General: She is not in acute distress.    Appearance: She is well-developed. She is not ill-appearing or diaphoretic.  HENT:     Head: Normocephalic and atraumatic.  Eyes:     Extraocular Movements: Extraocular movements intact.     Pupils: Pupils are equal, round, and reactive to light.  Cardiovascular:     Rate and Rhythm: Normal rate and regular rhythm.     Heart sounds: Normal heart sounds.  Pulmonary:     Effort: Pulmonary effort is normal.     Breath sounds: Normal breath sounds.  Abdominal:     Palpations: Abdomen is soft.     Tenderness: There is no abdominal tenderness.  Musculoskeletal:     Cervical back: Normal range of motion and neck supple.  Skin:    General: Skin is warm  and dry.  Neurological:     Mental Status: She is alert.     Comments: CN 3-12 grossly intact. 5/5 strength in all 4 extremities. Grossly normal sensation. Normal finger to nose.      ED Results / Procedures / Treatments   Labs (all labs ordered are listed, but only abnormal results are displayed) Labs Reviewed  CBC WITH DIFFERENTIAL/PLATELET - Abnormal; Notable for the following components:      Result Value   Hemoglobin 9.8 (*)    HCT 33.6 (*)    MCV 71.9 (*)    MCH 21.0 (*)    MCHC 29.2 (*)    RDW 16.2 (*)    Platelets 405 (*)    All other components within normal limits  COMPREHENSIVE METABOLIC PANEL - Abnormal; Notable for the following components:   Glucose, Bld 134 (*)    AST 11 (*)    Total Bilirubin 0.2 (*)    All other components within normal limits  CBG MONITORING, ED - Abnormal; Notable for the following components:   Glucose-Capillary 135 (*)    All other components within normal limits  HCG, SERUM, QUALITATIVE    EKG None  Radiology No results found.  Procedures Procedures    Medications Ordered in ED Medications  metoCLOPramide (REGLAN) injection 10 mg (10 mg Intravenous Given 06/11/22 2335)  ketorolac (TORADOL) 15 MG/ML injection 15 mg (15 mg Intravenous Given 06/11/22 2253)  diphenhydrAMINE (BENADRYL) injection 25 mg (25 mg Intravenous Given 06/11/22 2334)  sodium chloride 0.9 % bolus 1,000 mL (1,000 mLs Intravenous New Bag/Given 06/11/22 2251)    ED Course/ Medical Decision Making/ A&P                           Medical Decision Making Amount and/or Complexity of Data Reviewed Labs: ordered.    Details: Chronic anemia, normal WBC.  Not pregnant.  Mild hyperglycemia.  Risk Prescription drug management.   Patient presents with a gradually worsening headache over about 5 days.  History of prior headaches though she feels like this is a little different.  However no red flag symptoms on history or exam.  She is well-appearing.  She was given  Toradol as she initially did not want to take sedative medicines so she could drive home but it did not help as much so she eventually did take Reglan and Benadryl and now feels a lot better.  She is asking to leave.  Headache has resolved.  My suspicion of acute neuro emergency such as head bleed, aneurysm, meningitis/encephalitis, venous sinus thrombosis, etc.  is all pretty low.  We discussed return precautions but otherwise she appears stable for discharge.  Of note, her most recent blood pressure was elevated at 170/100 but this is abnormal compared to the other blood pressure readings and on repeat it was around 824 systolic.  I suspect this was not a true reading or at least a brief abnormality.        Final Clinical Impression(s) / ED Diagnoses Final diagnoses:  Frontal headache    Rx / DC Orders ED Discharge Orders     None         Sherwood Gambler, MD 06/11/22 2357

## 2022-06-11 NOTE — ED Notes (Signed)
Pt c/o HA x 4 days, denies n/v + photosensitivity

## 2022-06-11 NOTE — ED Notes (Signed)
Benadryl and Reglan held due to pt driving home.  Will see if fluids and Toradol helps with HA.  EDP made aware

## 2022-06-19 ENCOUNTER — Encounter: Payer: Self-pay | Admitting: Nurse Practitioner

## 2022-06-20 ENCOUNTER — Telehealth (INDEPENDENT_AMBULATORY_CARE_PROVIDER_SITE_OTHER): Payer: Self-pay | Admitting: Nurse Practitioner

## 2022-06-20 ENCOUNTER — Encounter: Payer: Self-pay | Admitting: Nurse Practitioner

## 2022-06-20 DIAGNOSIS — R300 Dysuria: Secondary | ICD-10-CM

## 2022-06-20 MED ORDER — FLUCONAZOLE 150 MG PO TABS: ORAL_TABLET | ORAL | 0 refills | Status: DC

## 2022-06-20 MED ORDER — NITROFURANTOIN MONOHYD MACRO 100 MG PO CAPS: 100.0000 mg | ORAL_CAPSULE | Freq: Two times a day (BID) | ORAL | 0 refills | Status: DC

## 2022-06-20 NOTE — Progress Notes (Signed)
[facility] [address] [place] [address] Dept: [phone] Dept Fax: [phone]  Virtual Video Visit  I connected with Stephania Fragmin on 06/20/22 at  1:20 PM EDT by a video enabled telemedicine application and verified that I am speaking with the correct person using two identifiers.  Location patient: Home Location provider: Clinic Persons participating in the virtual visit: Patient; Vance Peper, NP; Marchia Bond, CMA  I discussed the limitations of evaluation and management by telemedicine and the availability of in person appointments. The patient expressed understanding and agreed to proceed.  Chief Complaint  Patient presents with   Recurrent UTI    Pt c/o recurrent UTI w/ vaginal pain after urinating and bladder pressure x3 days.     SUBJECTIVE:  HPI: Jean Frank is a 21 y.o. female who presents with dysuria and urinary frequency for the last 3 days. It feels like the last time she had an UTI.  URINARY SYMPTOMS  Dysuria: yes Urinary frequency: yes Urgency: yes Small volume voids: yes Urinary incontinence: no Foul odor: no Hematuria: no Abdominal pain: no Back pain: no Suprapubic pain/pressure: yes Flank pain: no Fever:  no Vomiting: no Relief with cranberry juice:  mild Relief with pyridium:  n/a Status: worse Previous urinary tract infection: yes Recurrent urinary tract infection: no Treatments attempted: cranberry and increasing fluids    Patient Active Problem List   Diagnosis Date Noted   Elevated blood pressure reading 04/16/2022   Anemia 03/19/2022   Breast pain 02/12/2022   Chronic midline thoracic back pain 12/18/2021   Prediabetes 12/18/2021   Not well controlled moderate persistent asthma 06/21/2021   Seasonal and perennial allergic rhinitis 06/21/2021   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   Intrinsic atopic dermatitis 04/19/2021   Migraine without aura and without  status migrainosus, not intractable 11/01/2019   Asthma 08/17/2019   Chronic idiopathic urticaria 08/17/2019   Gastroesophageal reflux disease 07/20/2017   Allergic rhinitis 07/30/2016   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    Past Surgical History:  Procedure Laterality Date   NO PAST SURGERIES      Family History  Problem Relation Age of Onset   Hypertension Mother    Anxiety disorder Mother    Heart disease Father    Hypertension Father    Migraines Father        had migraines but years ago   Allergic rhinitis Brother    Asthma Brother    Diabetes Maternal Grandmother    Migraines Maternal Grandmother    Aneurysm Maternal Grandfather    Diabetes Paternal Grandmother    Angioedema Neg Hx    Eczema Neg Hx    Immunodeficiency Neg Hx    Urticaria Neg Hx    Seizures Neg Hx    Depression Neg Hx    Bipolar disorder Neg Hx    Schizophrenia Neg Hx    ADD / ADHD Neg Hx    Autism Neg Hx     Social History   Tobacco Use   Smoking status: Never    Passive exposure: Never   Smokeless tobacco: Never  Vaping Use   Vaping Use: Never used  Substance Use Topics   Alcohol use: No   Drug use: No     Current Outpatient Medications:    albuterol (PROVENTIL HFA) 108 (90 Base) MCG/ACT inhaler, Inhale 2 puffs into the lungs every 4 (four) hours as needed for wheezing or shortness of breath., Disp: 18 g, Rfl: 1   cetirizine (  ZYRTEC) 10 MG tablet, Take 1 tablet (10 mg total) by mouth 2 (two) times daily. Take one tablet once or twice a day for runny nose or itching, Disp: 60 tablet, Rfl: 5   EPIPEN 2-PAK 0.3 MG/0.3ML SOAJ injection, Inject 0.3 mg into the muscle as needed for anaphylaxis., Disp: 1 each, Rfl: 1   famotidine (PEPCID) 20 MG tablet, Take 1 tablet (20 mg total) by mouth 2 (two) times daily., Disp: 60 tablet, Rfl: 5   fluconazole (DIFLUCAN) 150 MG tablet, Take 1 tablet on the day you finish your antibiotic. Then you can take a second tablet in 3 days if you are  still having symptoms., Disp: 2 tablet, Rfl: 0   fluticasone (FLONASE) 50 MCG/ACT nasal spray, Place 1-2 sprays into both nostrils daily., Disp: 16 g, Rfl: 5   hydrocortisone 2.5 % cream, Apply topically 2 (two) times daily., Disp: 454 g, Rfl: 1   Iron, Ferrous Sulfate, 325 (65 Fe) MG TABS, Take 325 mg by mouth daily., Disp: 90 tablet, Rfl: 1   meloxicam (MOBIC) 15 MG tablet, Take 1 tablet (15 mg total) by mouth daily., Disp: 90 tablet, Rfl: 1   montelukast (SINGULAIR) 10 MG tablet, Take 1 tablet (10 mg total) by mouth at bedtime., Disp: 30 tablet, Rfl: 5   nitrofurantoin, macrocrystal-monohydrate, (MACROBID) 100 MG capsule, Take 1 capsule (100 mg total) by mouth 2 (two) times daily., Disp: 10 capsule, Rfl: 0  Allergies  Allergen Reactions   Shellfish Allergy     ROS: See pertinent positives and negatives per HPI.  OBSERVATIONS/OBJECTIVE:  VITALS per patient if applicable: There were no vitals filed for this visit. There is no height or weight on file to calculate BMI.    GENERAL: Alert and oriented. Appears well and in no acute distress.  HEENT: Atraumatic. Conjunctiva clear. No obvious abnormalities on inspection of external nose and ears.  NECK: Normal movements of the head and neck.  LUNGS: On inspection, no signs of respiratory distress. Breathing rate appears normal. No obvious gross SOB, gasping or wheezing, and no conversational dyspnea.  CV: No obvious cyanosis.  MS: Moves all visible extremities without noticeable abnormality.  PSYCH/NEURO: Pleasant and cooperative. No obvious depression or anxiety. Speech and thought processing grossly intact.  ASSESSMENT AND PLAN:  Problem List Items Addressed This Visit   None Visit Diagnoses     Dysuria    -  Primary   Most consistent with UTI. Start macrobid BIDx5 days. Encourage fluids, cont cranberry. Diflucan x1 after finishing antibiotic. F/U if not improving.        I discussed the assessment and treatment plan with  the patient. The patient was provided an opportunity to ask questions and all were answered. The patient agreed with the plan and demonstrated an understanding of the instructions.   The patient was advised to call back or seek an in-person evaluation if the symptoms worsen or if the condition fails to improve as anticipated.   Charyl Dancer, NP

## 2022-06-20 NOTE — Patient Instructions (Signed)
It was great to see you!  Start macrobid twice a day with food. I have sent in diflucan for you to take after you finish the antibiotic. Make sure you drink plenty of fluids.   Let's follow-up if your symptoms worsen or don't improve   Take care,  Vance Peper, NP

## 2022-07-08 ENCOUNTER — Ambulatory Visit (INDEPENDENT_AMBULATORY_CARE_PROVIDER_SITE_OTHER): Payer: Medicaid Other | Admitting: Family Medicine

## 2022-07-08 ENCOUNTER — Encounter: Payer: Self-pay | Admitting: Family Medicine

## 2022-07-08 ENCOUNTER — Telehealth: Payer: Self-pay | Admitting: Allergy and Immunology

## 2022-07-08 ENCOUNTER — Telehealth: Payer: Self-pay | Admitting: Nurse Practitioner

## 2022-07-08 ENCOUNTER — Other Ambulatory Visit: Payer: Self-pay

## 2022-07-08 VITALS — BP 118/72 | HR 78 | Temp 98.2°F | Resp 14 | Ht 68.4 in | Wt 217.8 lb

## 2022-07-08 DIAGNOSIS — J4540 Moderate persistent asthma, uncomplicated: Secondary | ICD-10-CM

## 2022-07-08 DIAGNOSIS — K219 Gastro-esophageal reflux disease without esophagitis: Secondary | ICD-10-CM | POA: Diagnosis not present

## 2022-07-08 DIAGNOSIS — T7800XD Anaphylactic reaction due to unspecified food, subsequent encounter: Secondary | ICD-10-CM

## 2022-07-08 DIAGNOSIS — L2084 Intrinsic (allergic) eczema: Secondary | ICD-10-CM | POA: Diagnosis not present

## 2022-07-08 DIAGNOSIS — J302 Other seasonal allergic rhinitis: Secondary | ICD-10-CM

## 2022-07-08 DIAGNOSIS — J3089 Other allergic rhinitis: Secondary | ICD-10-CM | POA: Diagnosis not present

## 2022-07-08 DIAGNOSIS — T7800XA Anaphylactic reaction due to unspecified food, initial encounter: Secondary | ICD-10-CM

## 2022-07-08 MED ORDER — AZELASTINE HCL 0.1 % NA SOLN: NASAL | 5 refills | Status: DC

## 2022-07-08 MED ORDER — FLUTICASONE PROPIONATE 50 MCG/ACT NA SUSP: 1.0000 | Freq: Every day | NASAL | 5 refills | Status: DC | PRN

## 2022-07-08 MED ORDER — SYMBICORT 160-4.5 MCG/ACT IN AERO: 2.0000 | INHALATION_SPRAY | Freq: Two times a day (BID) | RESPIRATORY_TRACT | 5 refills | Status: DC

## 2022-07-08 MED ORDER — DESONIDE 0.05 % EX CREA: TOPICAL_CREAM | Freq: Two times a day (BID) | CUTANEOUS | 5 refills | Status: DC | PRN

## 2022-07-08 MED ORDER — CETIRIZINE HCL 10 MG PO TABS: 10.0000 mg | ORAL_TABLET | Freq: Every day | ORAL | 5 refills | Status: DC

## 2022-07-08 MED ORDER — VENTOLIN HFA 108 (90 BASE) MCG/ACT IN AERS: 2.0000 | INHALATION_SPRAY | RESPIRATORY_TRACT | 1 refills | Status: DC | PRN

## 2022-07-08 MED ORDER — TRIAMCINOLONE ACETONIDE 0.1 % EX OINT: 1.0000 | TOPICAL_OINTMENT | Freq: Two times a day (BID) | CUTANEOUS | 5 refills | Status: DC | PRN

## 2022-07-08 MED ORDER — MONTELUKAST SODIUM 10 MG PO TABS: 10.0000 mg | ORAL_TABLET | Freq: Every day | ORAL | 5 refills | Status: DC

## 2022-07-08 MED ORDER — EPIPEN 2-PAK 0.3 MG/0.3ML IJ SOAJ: 0.3000 mg | INTRAMUSCULAR | 1 refills | Status: DC | PRN

## 2022-07-08 MED ORDER — FAMOTIDINE 20 MG PO TABS: 20.0000 mg | ORAL_TABLET | Freq: Two times a day (BID) | ORAL | 5 refills | Status: DC

## 2022-07-08 NOTE — Telephone Encounter (Signed)
Patient called because she has been having asthma flares and her inhaler has not been helping. She would like to be put on a different inhaler to help with frequent asthma flares and wheezing. She usually sees Thurston Hole and I added her for 2:30 today (07/08/22) for medication refills and asthma evaluation.  Patient is currently using Symbicort 160.

## 2022-07-08 NOTE — Telephone Encounter (Signed)
Called & provided pt with detailed VM, also sent MyChart message advising pt to call back to schedule an appt

## 2022-07-08 NOTE — Telephone Encounter (Signed)
Pt is needing a referral to a neurologist. I asked if she had spoken with Lauren about this, she couldn't remember. I let her know she may need an appointment. Please advise pt @ [phone] [facility])

## 2022-07-08 NOTE — Telephone Encounter (Signed)
Thank you :)

## 2022-07-08 NOTE — Progress Notes (Signed)
[address] [place] [address] Dept: [phone]  FOLLOW UP NOTE  Patient ID: Jean Frank, female    DOB: [birth year]  Age: 21 y.o. MRN: [id number] Date of Office Visit: 07/08/2022  Assessment  Chief Complaint: Follow-up (ASTHMA/), Asthma, Wheezing, Eczema, and Medication Refill  HPI Jean Frank is a 21 year old female who presents to the clinic for evaluation of not well-controlled asthma.  She was last seen in this clinic on 06/21/2022 by Thermon Leyland, FNP, for evaluation of asthma, allergic rhinitis, allergic conjunctivitis, atopic dermatitis, urticaria, and reflux.  At today's visit, she reports her asthma has been poorly controlled with symptoms beginning about 2 to 3 months ago and worsening over the last 2 weeks with the start of weather changes.  She reports asthma symptoms including shortness of breath, wheezing, and dry cough all occurring only at nighttime.  She continues montelukast 10 mg once a day, albuterol via nebulizer every night, and Symbicort 160-2 puffs once every 2 to 3 days.  Interestingly, her most recent CBC with differential from hospital visit on 06/11/2022 indicates absolute eosinophil count of 500.  Allergic rhinitis is reported moderately well controlled with nasal congestion occurring at nighttime and postnasal drainage as the main symptoms.  She continues cetirizine 10 mg once a day and is not currently using Flonase or nasal saline rinses.  She does report some epistaxis after using nasal saline rinses that occurred about 1 year ago.  Allergic conjunctivitis is reported as well controlled with no medical intervention.  Atopic dermatitis is reported as moderately well controlled with areas of dry, red, and itchy skin occurring on her face and back.  She continues daily moisturizing routine with Vaseline and Cetaphil.  She occasionally uses Differin gel for acne occurring on her face.  She denies any outbreaks of hives over the last year and has not used  famotidine recently.  Reflux is reported as well controlled with no heartburn or vomiting.  She continues to avoid shellfish with no accidental ingestion or EpiPen use since her last visit to this clinic.  She reports her epinephrine autoinjector prescription has expired at this time.  Her current medications are listed in the chart.   Drug Allergies:  Allergies  Allergen Reactions   Shellfish Allergy     Physical Exam: BP 118/72   Pulse 78   Temp 98.2 F (36.8 C) (Temporal)   Resp 14   Ht 5' 8.4" (1.737 m)   Wt 217 lb 12.8 oz (98.8 kg)   LMP 06/01/2022   SpO2 98%   BMI 32.73 kg/m    Physical Exam Vitals reviewed.  Constitutional:      Appearance: Normal appearance.  HENT:     Head: Normocephalic and atraumatic.     Right Ear: Tympanic membrane normal.     Left Ear: Tympanic membrane normal.     Nose:     Comments: Bilateral nares slightly erythematous with clear nasal drainage noted.  Pharynx normal.  Ears normal.  Eyes normal.    Mouth/Throat:     Pharynx: Oropharynx is clear.  Eyes:     Conjunctiva/sclera: Conjunctivae normal.  Cardiovascular:     Rate and Rhythm: Normal rate and regular rhythm.     Heart sounds: Normal heart sounds. No murmur heard. Pulmonary:     Effort: Pulmonary effort is normal.     Breath sounds: Normal breath sounds.     Comments: Lungs clear to auscultation Musculoskeletal:        General: Normal range  of motion.     Cervical back: Normal range of motion and neck supple.  Skin:    General: Skin is warm and dry.  Neurological:     Mental Status: She is alert and oriented to person, place, and time.  Psychiatric:        Mood and Affect: Mood normal.        Behavior: Behavior normal.        Thought Content: Thought content normal.     Diagnostics: FVC 3.63, FEV1 2.22.  Predicted FVC 3.63, predicted FEV1 3.20.  Spirometry indicates normal ventilatory function.  Assessment and Plan: 1. Not well controlled moderate persistent asthma    2. Seasonal and perennial allergic rhinitis   3. Intrinsic atopic dermatitis   4. Gastroesophageal reflux disease, unspecified whether esophagitis present   5. Allergy with anaphylaxis due to food     Meds ordered this encounter  Medications   SYMBICORT 160-4.5 MCG/ACT inhaler    Sig: Inhale 2 puffs into the lungs 2 (two) times daily.    Dispense:  10.2 g    Refill:  5   montelukast (SINGULAIR) 10 MG tablet    Sig: Take 1 tablet (10 mg total) by mouth at bedtime.    Dispense:  30 tablet    Refill:  5   VENTOLIN HFA 108 (90 Base) MCG/ACT inhaler    Sig: Inhale 2 puffs into the lungs every 4 (four) hours as needed for wheezing or shortness of breath.    Dispense:  18 g    Refill:  1   cetirizine (ZYRTEC) 10 MG tablet    Sig: Take 1 tablet (10 mg total) by mouth daily.    Dispense:  30 tablet    Refill:  5   fluticasone (FLONASE) 50 MCG/ACT nasal spray    Sig: Place 1-2 sprays into both nostrils daily as needed for allergies or rhinitis.    Dispense:  16 g    Refill:  5   azelastine (ASTELIN) 0.1 % nasal spray    Sig: 2 sprays each nostril 1-2 times daily prn. .    Dispense:  30 mL    Refill:  5   famotidine (PEPCID) 20 MG tablet    Sig: Take 1 tablet (20 mg total) by mouth 2 (two) times daily.    Dispense:  60 tablet    Refill:  5   desonide (DESOWEN) 0.05 % cream    Sig: Apply topically 2 (two) times daily as needed.    Dispense:  30 g    Refill:  5   triamcinolone ointment (KENALOG) 0.1 %    Sig: Apply 1 Application topically 2 (two) times daily as needed.    Dispense:  80 g    Refill:  5   EPIPEN 2-PAK 0.3 MG/0.3ML SOAJ injection    Sig: Inject 0.3 mg into the muscle as needed for anaphylaxis.    Dispense:  1 each    Refill:  1    Patient Instructions  Asthma Increase Symbicort 160 to 2 puffs twice a day with a spacer to prevent cough or wheeze.  Continue montelukast 10 mg once a day to prevent cough or wheeze Continue albuterol 2 puffs every 4 hours as needed  for cough or wheeze or albuterol 0.083% albuterol via nebulizer one unit vial every 4 hours as needed for cough or wheeze.  You may use albuterol 2 puffs 5-15 minutes before activity to decrease cough or wheeze Consider Dupixent injections for better  asthma control  Allergic rhinitis Continue allergen avoidance measures directed toward pollens, ragweed, dust mite, dog, and cockroach as listed below Continue cetirizine 10 mg once a day as needed for runny nose or itch Continue fluticasone nasal spray 1-2 sprays in each nostril once a day as needed for a stuffy nose Begin azelastine 2 sprays in each nostril once or twice a day as needed. This may help with sinus headache Consider saline nasal rinses as needed for nasal symptoms. Use this before any medicated nasal sprays for best result  Allergic conjunctivitis Some over the counter eye drops include Pataday one drop in each eye once a day as needed for red, itchy eyes OR Zaditor one drop in each eye twice a day as needed for red itchy eyes. Recommend use of a lubricating eyedrop such as blink or refresh.  Use this before using any medicated eyedrops  Hives (urticaria) Use the least amount of medications while remaining hive free. Cetirizine (Zyrtec) 10mg  twice a day and famotidine (Pepcid) 20 mg twice a day. If no symptoms for 7-14 days then decrease to. Cetirizine (Zyrtec) 10mg  twice a day and famotidine (Pepcid) 20 mg once a day.  If no symptoms for 7-14 days then decrease to. Cetirizine (Zyrtec) 10mg  twice a day.  If no symptoms for 7-14 days then decrease to. Cetirizine (Zyrtec) 10mg  once a day.  May use Benadryl (diphenhydramine) as needed for breakthrough hives       If symptoms return, then step up dosage Keep a detailed symptom journal including foods eaten, contact with allergens, medications taken, weather changes.   Atopic dermatitis Continue twice a day moisturizing routine For red itchy areas on your face and neck, begin  desonide 0.05 ointment twice a day as needed. For red itchy areas below your face and neck, begin triamcinolone 0.1% ointment twice a day as needed. Consider Dupixent to help reduce symptoms of eczema  Reflux Restart famotidine 20 mg twice a day for reflux. This may help with wheezing and coughing at night Continue dietary and lifestyle modifications as listed below  Food allergy Continue to avoid shellfish. In case of an allergic reaction, take Benadryl 50 mg every 4 hours, and if life-threatening symptoms occur, inject with EpiPen 0.3 mg. Return to the clinic if you are interested in updating your food allergy testing. Remember to stop your antihistamines for 3 days before your testing appointment  Call the clinic if this treatment plan is not working well for you  Follow up in 2 months or sooner if needed.   Return in about 2 months (around 09/07/2022), or if symptoms worsen or fail to improve.    Thank you for the opportunity to care for this patient.  Please do not hesitate to contact me with questions.  Gareth Morgan, FNP [facility]

## 2022-07-08 NOTE — Telephone Encounter (Signed)
Patient called and needs refills for epi-pens, proventil and hydrocortisone cream for skin.  [facility] [address] [phone]

## 2022-07-08 NOTE — Patient Instructions (Addendum)
Asthma Increase Symbicort 160 to 2 puffs twice a day with a spacer to prevent cough or wheeze.  Continue montelukast 10 mg once a day to prevent cough or wheeze Continue albuterol 2 puffs every 4 hours as needed for cough or wheeze or albuterol 0.083% albuterol via nebulizer one unit vial every 4 hours as needed for cough or wheeze.  You may use albuterol 2 puffs 5-15 minutes before activity to decrease cough or wheeze Consider Dupixent injections for better asthma control  Allergic rhinitis Continue allergen avoidance measures directed toward pollens, ragweed, dust mite, dog, and cockroach as listed below Continue cetirizine 10 mg once a day as needed for runny nose or itch Continue fluticasone nasal spray 1-2 sprays in each nostril once a day as needed for a stuffy nose Begin azelastine 2 sprays in each nostril once or twice a day as needed. This may help with sinus headache Consider saline nasal rinses as needed for nasal symptoms. Use this before any medicated nasal sprays for best result  Allergic conjunctivitis Some over the counter eye drops include Pataday one drop in each eye once a day as needed for red, itchy eyes OR Zaditor one drop in each eye twice a day as needed for red itchy eyes. Recommend use of a lubricating eyedrop such as blink or refresh.  Use this before using any medicated eyedrops  Hives (urticaria) Use the least amount of medications while remaining hive free. Cetirizine (Zyrtec) 10mg  twice a day and famotidine (Pepcid) 20 mg twice a day. If no symptoms for 7-14 days then decrease to. Cetirizine (Zyrtec) 10mg  twice a day and famotidine (Pepcid) 20 mg once a day.  If no symptoms for 7-14 days then decrease to. Cetirizine (Zyrtec) 10mg  twice a day.  If no symptoms for 7-14 days then decrease to. Cetirizine (Zyrtec) 10mg  once a day.  May use Benadryl (diphenhydramine) as needed for breakthrough hives       If symptoms return, then step up dosage Keep a detailed  symptom journal including foods eaten, contact with allergens, medications taken, weather changes.   Atopic dermatitis Continue twice a day moisturizing routine For red itchy areas on your face and neck, begin desonide 0.05 ointment twice a day as needed. For red itchy areas below your face and neck, begin triamcinolone 0.1% ointment twice a day as needed. Consider Dupixent to help reduce symptoms of eczema  Reflux Restart famotidine 20 mg twice a day for reflux. This may help with wheezing and coughing at night Continue dietary and lifestyle modifications as listed below  Food allergy Continue to avoid shellfish. In case of an allergic reaction, take Benadryl 50 mg every 4 hours, and if life-threatening symptoms occur, inject with EpiPen 0.3 mg. Return to the clinic if you are interested in updating your food allergy testing. Remember to stop your antihistamines for 3 days before your testing appointment  Call the clinic if this treatment plan is not working well for you  Follow up in 2 months or sooner if needed.  Reducing Pollen Exposure The American Academy of Allergy, Asthma and Immunology suggests the following steps to reduce your exposure to pollen during allergy seasons. Do not hang sheets or clothing out to dry; pollen may collect on these items. Do not mow lawns or spend time around freshly cut grass; mowing stirs up pollen. Keep windows closed at night.  Keep car windows closed while driving. Minimize morning activities outdoors, a time when pollen counts are usually at their highest. Stay  indoors as much as possible when pollen counts or humidity is high and on windy days when pollen tends to remain in the air longer. Use air conditioning when possible.  Many air conditioners have filters that trap the pollen spores. Use a HEPA room air filter to remove pollen form the indoor air you breathe.   Control of Dust Mite Allergen Dust mites play a major role in allergic asthma  and rhinitis. They occur in environments with high humidity wherever human skin is found. Dust mites absorb humidity from the atmosphere (ie, they do not drink) and feed on organic matter (including shed human and animal skin). Dust mites are a microscopic type of insect that you cannot see with the naked eye. High levels of dust mites have been detected from mattresses, pillows, carpets, upholstered furniture, bed covers, clothes, soft toys and any woven material. The principal allergen of the dust mite is found in its feces. A gram of dust may contain 1,000 mites and 250,000 fecal particles. Mite antigen is easily measured in the air during house cleaning activities. Dust mites do not bite and do not cause harm to humans, other than by triggering allergies/asthma.  Ways to decrease your exposure to dust mites in your home:  1. Encase mattresses, box springs and pillows with a mite-impermeable barrier or cover  2. Wash sheets, blankets and drapes weekly in hot water (130 F) with detergent and dry them in a dryer on the hot setting.  3. Have the room cleaned frequently with a vacuum cleaner and a damp dust-mop. For carpeting or rugs, vacuuming with a vacuum cleaner equipped with a high-efficiency particulate air (HEPA) filter. The dust mite allergic individual should not be in a room which is being cleaned and should wait 1 hour after cleaning before going into the room.  4. Do not sleep on upholstered furniture (eg, couches).  5. If possible removing carpeting, upholstered furniture and drapery from the home is ideal. Horizontal blinds should be eliminated in the rooms where the person spends the most time (bedroom, study, television room). Washable vinyl, roller-type shades are optimal.  6. Remove all non-washable stuffed toys from the bedroom. Wash stuffed toys weekly like sheets and blankets above.  7. Reduce indoor humidity to less than 50%. Inexpensive humidity monitors can be purchased at  most hardware stores. Do not use a humidifier as can make the problem worse and are not recommended.  Control of Cockroach Allergen Cockroach allergen has been identified as an important cause of acute attacks of asthma, especially in urban settings.  There are fifty-five species of cockroach that exist in the [place], however only three, the [place], [place] species produce allergen that can affect patients with Asthma.  Allergens can be obtained from fecal particles, egg casings and secretions from cockroaches.    Remove food sources. Reduce access to water. Seal access and entry points. Spray runways with 0.5-1% Diazinon or Chlorpyrifos Blow boric acid power under stoves and refrigerator. Place bait stations (hydramethylnon) at feeding sites.    Lifestyle Changes for Controlling GERD When you have GERD, stomach acid feels as if it's backing up toward your mouth. Whether or not you take medication to control your GERD, your symptoms can often be improved with lifestyle changes.   Raise Your Head Reflux is more likely to strike when you're lying down flat, because stomach fluid can flow backward more easily. Raising the head of your bed 4-6 inches can help. To do this: Slide  blocks or books under the legs at the head of your bed. Or, place a wedge under the mattress. Many foam stores can make a suitable wedge for you. The wedge should run from your waist to the top of your head. Don't just prop your head on several pillows. This increases pressure on your stomach. It can make GERD worse.  Watch Your Eating Habits Certain foods may increase the acid in your stomach or relax the lower esophageal sphincter, making GERD more likely. It's best to avoid the following: Coffee, tea, and carbonated drinks (with and without caffeine) Fatty, fried, or spicy food Mint, chocolate, onions, and tomatoes Any other foods that seem to irritate your stomach or cause you  pain  Relieve the Pressure Eat smaller meals, even if you have to eat more often. Don't lie down right after you eat. Wait a few hours for your stomach to empty. Avoid tight belts and tight-fitting clothes. Lose excess weight.  Tobacco and Alcohol Avoid smoking tobacco and drinking alcohol. They can make GERD symptoms worse. Begin Symbicort

## 2022-07-17 ENCOUNTER — Ambulatory Visit (INDEPENDENT_AMBULATORY_CARE_PROVIDER_SITE_OTHER): Payer: Medicaid Other | Admitting: Nurse Practitioner

## 2022-07-17 ENCOUNTER — Encounter: Payer: Self-pay | Admitting: Nurse Practitioner

## 2022-07-17 VITALS — BP 132/85 | HR 83 | Temp 98.0°F | Ht 68.4 in | Wt 214.6 lb

## 2022-07-17 DIAGNOSIS — G43009 Migraine without aura, not intractable, without status migrainosus: Secondary | ICD-10-CM

## 2022-07-17 DIAGNOSIS — J069 Acute upper respiratory infection, unspecified: Secondary | ICD-10-CM | POA: Diagnosis not present

## 2022-07-17 LAB — POCT INFLUENZA A/B
Influenza A, POC: NEGATIVE
Influenza B, POC: NEGATIVE

## 2022-07-17 LAB — POC COVID19 BINAXNOW: SARS Coronavirus 2 Ag: NEGATIVE

## 2022-07-17 MED ORDER — SUMATRIPTAN SUCCINATE 25 MG PO TABS: 25.0000 mg | ORAL_TABLET | ORAL | 0 refills | Status: AC | PRN

## 2022-07-17 NOTE — Assessment & Plan Note (Signed)
Chronic, not controlled. Her migraines have been worsening over the past few weeks. She was seeing pediatric neurology and does not remember what medication they prescribed her, however she feels like that did not help. Will start her on imitrex 25mg  as needed for migraine. Will also place referral to neurology per patient request.

## 2022-07-17 NOTE — Patient Instructions (Signed)
It was great to see you!  I have placed a referral to neurology. Start imitrex 1 tablet at the first sign of a migraine, you can take a second tablet 2 hours later.   You tested negative for flu and covid-19. Most likely a virus. Keep taking the theraflu. You can gargle with warm salt water and take ibuprofen as needed for sore throat.   Let's follow-up if your symptoms worsen or don't improve.   Take care,  Rodman Pickle, NP

## 2022-07-17 NOTE — Progress Notes (Signed)
Acute Office Visit  Subjective:     Patient ID: Jean Frank, female    DOB: [birth year], 21 y.o.   MRN: [id number]  Chief Complaint  Patient presents with   Acute Visit    Pt would like a referral to a neuro due to aging out from her pediatric nero. Pt c/o  sinus issues sore throat sneezing and coughing nausea  x 2 days    HPI Patient is in today for sore throat, coughing, and sneezing for 2 days. Also her migraines have been getting worse recently. She was seeing neurology as a teen, however needs a new neurologist since she was seeing a pediatric neurologist. She has been taking ibuprofen, tylenol, and BC powder to help with her headaches. This does not always help.   UPPER RESPIRATORY TRACT INFECTION  Fever: no Cough: yes Shortness of breath: yes Wheezing: no Chest pain: no Chest tightness: no Chest congestion: no Nasal congestion: yes Runny nose: yes Post nasal drip: yes Sneezing: yes Sore throat: yes Swollen glands: no Sinus pressure: yes Headache: yes Face pain: no Toothache: no Ear pain: no bilateral Ear pressure: no bilateral Eyes red/itching:no Eye drainage/crusting: no  Vomiting: no Nausea: yes Rash: no Fatigue: yes Sick contacts: no Strep contacts: no  Context: stable Recurrent sinusitis: no Relief with OTC cold/cough medications: yes  Treatments attempted: theraflu    ROS See pertinent positives and negatives per HPI.     Objective:    BP 132/85 (BP Location: Right Arm, Patient Position: Sitting, Cuff Size: Large)   Pulse 83   Temp 98 F (36.7 C) (Temporal)   Ht 5' 8.4" (1.737 m)   Wt 214 lb 9.6 oz (97.3 kg)   SpO2 98%   BMI 32.25 kg/m    Physical Exam Vitals and nursing note reviewed.  Constitutional:      General: She is not in acute distress.    Appearance: Normal appearance.  HENT:     Head: Normocephalic.     Right Ear: Tympanic membrane, ear canal and external ear normal.     Left Ear: Tympanic membrane, ear canal  and external ear normal.     Nose:     Right Sinus: Maxillary sinus tenderness and frontal sinus tenderness present.     Left Sinus: Maxillary sinus tenderness and frontal sinus tenderness present.  Eyes:     Conjunctiva/sclera: Conjunctivae normal.  Cardiovascular:     Rate and Rhythm: Normal rate and regular rhythm.     Pulses: Normal pulses.     Heart sounds: Normal heart sounds.  Pulmonary:     Effort: Pulmonary effort is normal.     Breath sounds: Normal breath sounds.  Musculoskeletal:     Cervical back: Normal range of motion and neck supple. No tenderness.  Lymphadenopathy:     Cervical: No cervical adenopathy.  Skin:    General: Skin is warm.  Neurological:     General: No focal deficit present.     Mental Status: She is alert and oriented to person, place, and time.  Psychiatric:        Mood and Affect: Mood normal.        Behavior: Behavior normal.        Thought Content: Thought content normal.        Judgment: Judgment normal.     Results for orders placed or performed in visit on 07/17/22  POCT Influenza A/B  Result Value Ref Range   Influenza A, POC Negative Negative  Influenza B, POC Negative Negative  POC COVID-19 BinaxNow  Result Value Ref Range   SARS Coronavirus 2 Ag Negative Negative        Assessment & Plan:   Problem List Items Addressed This Visit       Cardiovascular and Mediastinum   Migraine without aura and without status migrainosus, not intractable - Primary    Chronic, not controlled. Her migraines have been worsening over the past few weeks. She was seeing pediatric neurology and does not remember what medication they prescribed her, however she feels like that did not help. Will start her on imitrex 25mg  as needed for migraine. Will also place referral to neurology per patient request.       Relevant Medications   SUMAtriptan (IMITREX) 25 MG tablet   Other Relevant Orders   Ambulatory referral to Neurology   Other Visit  Diagnoses     Upper respiratory tract infection, unspecified type       Covid-19 and flu negative. Most likely viral. Continue theraflu prn. Can gargle warm salt water and take ibuprofen to help sore throat. F/U if not improving   Relevant Orders   POCT Influenza A/B (Completed)   POC COVID-19 BinaxNow (Completed)       Meds ordered this encounter  Medications   SUMAtriptan (IMITREX) 25 MG tablet    Sig: Take 1 tablet (25 mg total) by mouth every 2 (two) hours as needed for migraine. May repeat in 2 hours if headache persists or recurs.    Dispense:  10 tablet    Refill:  0    Return if symptoms worsen or fail to improve.  , NP

## 2022-07-18 ENCOUNTER — Encounter: Payer: Self-pay | Admitting: Neurology

## 2022-07-23 ENCOUNTER — Telehealth: Payer: Medicaid Other | Admitting: Nurse Practitioner

## 2022-07-31 ENCOUNTER — Other Ambulatory Visit (HOSPITAL_COMMUNITY)
Admission: RE | Admit: 2022-07-31 | Discharge: 2022-07-31 | Disposition: A | Discharge status: Home / Self Care | Payer: Medicaid Other | Source: Ambulatory Visit | Attending: Nurse Practitioner | Admitting: Nurse Practitioner

## 2022-07-31 ENCOUNTER — Ambulatory Visit: Payer: Medicaid Other | Admitting: Nurse Practitioner

## 2022-07-31 ENCOUNTER — Encounter: Payer: Self-pay | Admitting: Nurse Practitioner

## 2022-07-31 ENCOUNTER — Ambulatory Visit (INDEPENDENT_AMBULATORY_CARE_PROVIDER_SITE_OTHER): Payer: Medicaid Other | Admitting: Nurse Practitioner

## 2022-07-31 VITALS — BP 112/70 | HR 97 | Temp 97.3°F | Ht 68.4 in | Wt 212.8 lb

## 2022-07-31 DIAGNOSIS — R350 Frequency of micturition: Secondary | ICD-10-CM

## 2022-07-31 LAB — POCT URINALYSIS DIP (CLINITEK)
Bilirubin, UA: NEGATIVE
Blood, UA: NEGATIVE
Glucose, UA: NEGATIVE mg/dL
Leukocytes, UA: NEGATIVE
Nitrite, UA: NEGATIVE
POC PROTEIN,UA: NEGATIVE
Spec Grav, UA: 1.01 (ref 1.010–1.025)
Urobilinogen, UA: NEGATIVE E.U./dL — AB
pH, UA: 8 (ref 5.0–8.0)

## 2022-07-31 MED ORDER — CEPHALEXIN 250 MG/5ML PO SUSR: 500.0000 mg | Freq: Two times a day (BID) | ORAL | 0 refills | Status: AC

## 2022-07-31 NOTE — Patient Instructions (Signed)
It was great to see you!  Start keflex 60ml twice a day for 7 days. You will have some left over medicine in the bottle.   Let's follow-up if your symptoms worsen or any concerns.   Take care,  Rodman Pickle, NP

## 2022-07-31 NOTE — Progress Notes (Signed)
Acute Office Visit  Subjective:     Patient ID: Jean Frank, female    DOB: Jun 17, 2001, 21 y.o.   MRN: [id number]  Chief Complaint  Patient presents with   Acute Visit    Possible uti started 3 days after she has ended her periods. Pressure ,odor with urination  , no fever , no blood in urine, urine freq.     HPI Patient is in today for urinary frequency, pressure, and clear vaginal discharge.  URINARY SYMPTOMS  Dysuria: no Urinary frequency: yes Urgency: yes Small volume voids: yes Urinary incontinence: no Foul odor: yes Hematuria: no Abdominal pain: no Back pain: no Suprapubic pain/pressure: yes Flank pain: no Fever:  no Vomiting: no Relief with cranberry juice: yes Relief with pyridium:  n/a Status: stable Previous urinary tract infection: yes Recurrent urinary tract infection: no Treatments attempted: cranberry and increasing fluids   ROS See pertinent positives and negatives per HPI.     Objective:    BP 112/70   Pulse 97   Temp (!) 97.3 F (36.3 C)   Ht 5' 8.4" (1.737 m)   Wt 212 lb 12.8 oz (96.5 kg)   LMP 07/21/2022   SpO2 99%   BMI 31.98 kg/m    Physical Exam Vitals and nursing note reviewed.  Constitutional:      General: She is not in acute distress.    Appearance: Normal appearance.  HENT:     Head: Normocephalic.  Eyes:     Conjunctiva/sclera: Conjunctivae normal.  Pulmonary:     Effort: Pulmonary effort is normal.  Abdominal:     Palpations: Abdomen is soft.     Tenderness: There is no abdominal tenderness. There is no right CVA tenderness or left CVA tenderness.  Musculoskeletal:     Cervical back: Normal range of motion.  Skin:    General: Skin is warm.  Neurological:     General: No focal deficit present.     Mental Status: She is alert and oriented to person, place, and time.  Psychiatric:        Mood and Affect: Mood normal.        Behavior: Behavior normal.        Thought Content: Thought content normal.         Judgment: Judgment normal.     Results for orders placed or performed in visit on 07/31/22  POCT URINALYSIS DIP (CLINITEK)  Result Value Ref Range   Color, UA yellow yellow   Clarity, UA clear clear   Glucose, UA negative negative mg/dL   Bilirubin, UA negative negative   Ketones, POC UA small (15) (A) negative mg/dL   Spec Grav, UA 7.253 6.644 - 1.025   Blood, UA negative negative   pH, UA 8.0 5.0 - 8.0   POC PROTEIN,UA negative negative, trace   Urobilinogen, UA negative (A) 0.2 or 1.0 E.U./dL   Nitrite, UA Negative Negative   Leukocytes, UA Negative Negative        Assessment & Plan:   Problem List Items Addressed This Visit   None Visit Diagnoses     Urine frequency    -  Primary   UA is negative, with symptoms we will add on urine culture and check for BV, yeast.  Encourage fluids.  Treat based on results.   Relevant Orders   POCT URINALYSIS DIP (CLINITEK) (Completed)   Urine cytology ancillary only   Urine Culture       Meds ordered this encounter  Medications  cephALEXin (KEFLEX) 250 MG/5ML suspension    Sig: Take 10 mLs (500 mg total) by mouth 2 (two) times daily for 7 days.    Dispense:  200 mL    Refill:  0    Return if symptoms worsen or fail to improve.  Gerre Scull, NP

## 2022-08-01 ENCOUNTER — Encounter: Payer: Self-pay | Admitting: Nurse Practitioner

## 2022-08-03 LAB — URINE CULTURE
MICRO NUMBER:: 14315903
SPECIMEN QUALITY:: ADEQUATE

## 2022-08-05 LAB — URINE CYTOLOGY ANCILLARY ONLY
Bacterial Vaginitis-Urine: NEGATIVE
Candida Urine: NEGATIVE
Comment: NEGATIVE
Trichomonas: NEGATIVE

## 2022-08-21 ENCOUNTER — Telehealth: Payer: Self-pay | Admitting: Nurse Practitioner

## 2022-08-21 ENCOUNTER — Ambulatory Visit: Payer: Medicaid Other | Admitting: Nurse Practitioner

## 2022-08-21 NOTE — Telephone Encounter (Signed)
Pt was a no show 1/04 for OV with Jean Frank. This is her first, letter sent

## 2022-08-22 NOTE — Telephone Encounter (Signed)
noted 

## 2022-08-22 NOTE — Telephone Encounter (Signed)
1st no show, fee waived, letter sent 

## 2022-08-27 ENCOUNTER — Ambulatory Visit (INDEPENDENT_AMBULATORY_CARE_PROVIDER_SITE_OTHER): Payer: Medicaid Other | Admitting: Nurse Practitioner

## 2022-08-27 ENCOUNTER — Telehealth: Payer: Self-pay | Admitting: Nurse Practitioner

## 2022-08-27 ENCOUNTER — Encounter: Payer: Self-pay | Admitting: Nurse Practitioner

## 2022-08-27 VITALS — BP 114/72 | HR 98 | Temp 97.1°F | Ht 68.4 in | Wt 208.8 lb

## 2022-08-27 DIAGNOSIS — K219 Gastro-esophageal reflux disease without esophagitis: Secondary | ICD-10-CM | POA: Diagnosis not present

## 2022-08-27 MED ORDER — OMEPRAZOLE 2 MG/ML ORAL SUSPENSION: 20.0000 mg | Freq: Every day | ORAL | 1 refills | Status: DC

## 2022-08-27 NOTE — Telephone Encounter (Signed)
Caller Name: Cassey Bacigalupo Call back phone #: [phone]  Reason for Call: Pt is has questions about med prescribed to her during OV 1/10. Please call back

## 2022-08-27 NOTE — Telephone Encounter (Signed)
Pt went the pharmacy was told that she needs PA

## 2022-08-27 NOTE — Telephone Encounter (Signed)
Pt at her pharmacy to pick up medication and they told her they waiting on authorization . Please call pt

## 2022-08-27 NOTE — Patient Instructions (Signed)
It was great to see you!  Start omeprazole 29ml daily. Take this in the morning on an empty stomach. You can still take tums and pepcid if needed.   Let's follow-up in 3-4 weeks, sooner if you have concerns.  If a referral was placed today, you will be contacted for an appointment. Please note that routine referrals can sometimes take up to 3-4 weeks to process. Please call our office if you haven't heard anything after this time frame.  Take care,  Vance Peper, NP

## 2022-08-27 NOTE — Progress Notes (Unsigned)
Acute Office Visit  Subjective:     Patient ID: Jean Frank, female    DOB: [birth year], 20 y.o.   MRN: [id number]  Chief Complaint  Patient presents with   Acute Visit    Heart burn and acid reflux  , having pain in chest and  stomach pain and going into lower, taking TUMS  not helping, not able eat much because of pain, going for about 3-4 day, no diaherra , some nausea , burning in chest , taking medication for acid reflux hasn't help    HPI Patient is in today for acid reflux associated with burning in her chest. She states that she has not been eating much the past 3 days due to the pain. She does endorse some nausea. She denies constipation, diarrhea, vomiting, and fevers. She has tried tums, pepcid, and peptobismol without relief.   ROS See pertinent positives and negatives per HPI.     Objective:    BP 114/72   Pulse 98   Temp (!) 97.1 F (36.2 C)   Ht 5' 8.4" (1.737 m)   Wt 208 lb 12.8 oz (94.7 kg)   LMP 08/15/2022   SpO2 97%   BMI 31.38 kg/m    Physical Exam Vitals and nursing note reviewed.  Constitutional:      General: She is not in acute distress.    Appearance: Normal appearance.  HENT:     Head: Normocephalic.  Eyes:     Conjunctiva/sclera: Conjunctivae normal.  Cardiovascular:     Rate and Rhythm: Normal rate and regular rhythm.     Pulses: Normal pulses.     Heart sounds: Normal heart sounds.  Pulmonary:     Effort: Pulmonary effort is normal.     Breath sounds: Normal breath sounds.  Abdominal:     Palpations: Abdomen is soft.     Tenderness: There is abdominal tenderness in the epigastric area. There is no right CVA tenderness, left CVA tenderness, guarding or rebound. Negative signs include Murphy's sign and McBurney's sign.  Musculoskeletal:     Cervical back: Normal range of motion.  Skin:    General: Skin is warm.  Neurological:     General: No focal deficit present.     Mental Status: She is alert and oriented to person,  place, and time.  Psychiatric:        Mood and Affect: Mood normal.        Behavior: Behavior normal.        Thought Content: Thought content normal.        Judgment: Judgment normal.      Assessment & Plan:   Problem List Items Addressed This Visit       Digestive   Gastroesophageal reflux disease - Primary    Chronic, not controlled. She has tried tums, which helps for a few minutes, pepcid, and peptobismol. Will have her start omeprazole 20mg  daily. She has a hard time with pills, will send in a suspension. Recommend she eat small, bland foods. Follow-up in 3-4 weeks.       Relevant Medications   omeprazole (KONVOMEP) 2 mg/mL SUSP oral suspension    Meds ordered this encounter  Medications   omeprazole (KONVOMEP) 2 mg/mL SUSP oral suspension    Sig: Take 10 mLs (20 mg total) by mouth daily.    Dispense:  300 mL    Refill:  1    Return in about 4 weeks (around 09/24/2022) for 3-4 weeks acid reflux.  Ander Purpura  Avelina Laine, NP

## 2022-08-27 NOTE — Telephone Encounter (Signed)
Pharmacy is need PA sent this over to the PA team for review

## 2022-08-28 ENCOUNTER — Other Ambulatory Visit (HOSPITAL_COMMUNITY): Payer: Self-pay

## 2022-08-28 ENCOUNTER — Telehealth: Payer: Self-pay | Admitting: Nurse Practitioner

## 2022-08-28 NOTE — Telephone Encounter (Signed)
Pharmacy Patient Advocate Encounter   Received notification that prior authorization for Konvomep 2-84MG /ML suspension is required/requested.   PA submitted on 08/28/22 to (ins) [facility] Medicaid of [place] via CoverMyMeds Key [id number] Status is pending

## 2022-08-28 NOTE — Telephone Encounter (Signed)
Routed to [employer], per previous message

## 2022-08-28 NOTE — Telephone Encounter (Signed)
Pt stated that she went to the pharmacy to pick up her omeprazole  and the pharmacy said they are still waiting for authorization

## 2022-08-28 NOTE — Assessment & Plan Note (Addendum)
Chronic, not controlled. She has tried tums, which helps for a few minutes, pepcid, and peptobismol. Will have her start omeprazole 20mg  daily. She has a hard time with pills, will send in a suspension. Recommend she eat small, bland foods. Follow-up in 3-4 weeks.

## 2022-08-29 ENCOUNTER — Telehealth: Payer: Self-pay | Admitting: Nurse Practitioner

## 2022-08-29 ENCOUNTER — Other Ambulatory Visit (HOSPITAL_COMMUNITY): Payer: Self-pay

## 2022-08-29 MED ORDER — ESOMEPRAZOLE MAGNESIUM 20 MG PO PACK: 20.0000 mg | PACK | Freq: Every day | ORAL | 1 refills | Status: DC

## 2022-08-29 NOTE — Telephone Encounter (Signed)
Forwarding

## 2022-08-29 NOTE — Telephone Encounter (Signed)
See or telephone encounter

## 2022-08-29 NOTE — Telephone Encounter (Signed)
Pt called and stated she been going back and forth with pharmacy about her medication that needs authorization . Please give the pt a call

## 2022-08-29 NOTE — Telephone Encounter (Signed)
Denial documented in separate encounter, please advise

## 2022-08-29 NOTE — Telephone Encounter (Signed)
Called and  spoke with patient informed pt that her PA was denied. Let pt know that we have switched medication to Nexium and her should cover this . I told if the insurance doesn't cover this  to please give [place] a call back.

## 2022-08-29 NOTE — Telephone Encounter (Signed)
See other phone call

## 2022-08-29 NOTE — Telephone Encounter (Signed)
Received a fax regarding Prior Authorization from [facility] for [place]. Authorization has been DENIED because Per the health plan's preferred drug list, at least 2 preferred drugs must be tried before requesting this drug  or tell us why the member cannot try any preferred alternatives. Please send [place] supporting chart notes and  lab results. Here is list of preferred alternatives: Dexilant Capsule, esomeprazole magnesium (capsule, OTC  tablet), lansoprazole capsule, Nexium Rx Packet, omeprazole Rx capsule, pantoprazole tablet, Protonix  Suspension.  Phone# [phone]

## 2022-09-03 ENCOUNTER — Other Ambulatory Visit: Payer: Self-pay

## 2022-09-03 MED ORDER — NEXIUM 20 MG PO PACK: 20.0000 mg | PACK | Freq: Every day | ORAL | 12 refills | Status: AC

## 2022-09-09 ENCOUNTER — Ambulatory Visit: Payer: Medicaid Other | Admitting: Internal Medicine

## 2022-09-19 NOTE — Telephone Encounter (Signed)
Patient returned call. She said since she was waiting prior approval of the Nexium prescription and was hurting so bad she just purchased the RX of it over the counter. Miss Dearing said that her pain has since went away and will call our office if she has any other concerns.

## 2022-09-23 NOTE — Progress Notes (Deleted)
   Established Patient Office Visit  Subjective   Patient ID: Jean Frank, female    DOB: Dec 27, 2000  Age: 22 y.o. MRN: [id number]  No chief complaint on file.   HPI  Jean Frank is here to follow-up on GERD. She was having trouble getting her medication due to insurance prior authorizations. She started nexium and pepcid.   {History (Optional):23778}  ROS    Objective:     LMP 08/15/2022  {Vitals History (Optional):23777}  Physical Exam   No results found for any visits on 09/24/22.  {Labs (Optional):23779}  The ASCVD Risk score (Arnett DK, et al., 2019) failed to calculate for the following reasons:   The 2019 ASCVD risk score is only valid for ages 76 to 63    Assessment & Plan:   Problem List Items Addressed This Visit   None   No follow-ups on file.    Charyl Dancer, NP

## 2022-09-24 ENCOUNTER — Telehealth: Payer: Self-pay | Admitting: Nurse Practitioner

## 2022-09-24 ENCOUNTER — Ambulatory Visit: Payer: Medicaid Other | Admitting: Nurse Practitioner

## 2022-09-24 NOTE — Telephone Encounter (Signed)
2.7.24 no show letter sent 

## 2022-09-29 NOTE — Telephone Encounter (Signed)
2nd no show, no fee due to Medicaid coverage, final warning letter sent to reschedule/notify of policy

## 2022-09-29 NOTE — Telephone Encounter (Signed)
Noted  

## 2022-10-21 ENCOUNTER — Telehealth: Payer: Self-pay

## 2022-10-21 ENCOUNTER — Other Ambulatory Visit (HOSPITAL_COMMUNITY): Payer: Self-pay

## 2022-10-21 NOTE — Telephone Encounter (Signed)
Pharmacy Patient Advocate Encounter   Received notification that prior authorization for Esomeprazle '20mg'$  packets is required/requested.  Per Test Claim: prior authorization required   PA submitted on 10/21/22 to (ins) [facility] Medicaid via CoverMyMeds Key or (Medicaid) confirmation # [id number]  Prior Authorization for Esomeprazole '20mg'$  packets has been approved, this drug is covered on the preferred drug list.    PA# PA Case ID: [id number] Effective dates: 10/21/22 through 08/18/23

## 2022-10-23 ENCOUNTER — Encounter: Payer: Self-pay | Admitting: Radiology

## 2022-10-29 ENCOUNTER — Ambulatory Visit: Payer: Medicaid Other | Admitting: Internal Medicine

## 2022-11-04 NOTE — Telephone Encounter (Signed)
Placed a call to [facility] to notify them to use [facility] [id number]. The pharmacist stated they will have to order the medication for the patient to come tomorrow.

## 2022-11-04 NOTE — Telephone Encounter (Signed)
I called [facility] to ask for Rx to put on hold.

## 2022-11-04 NOTE — Telephone Encounter (Signed)
I called patient to notify her that Nexium has been approved. She has purchased this medication over the counter and does not need it now.

## 2022-11-04 NOTE — Telephone Encounter (Signed)
Thanks for sharing! I found the issue:  Please see Plan Response from March request:  Approved. This drug is covered on the Preferred Drug List. It does not require prior approval. Only specific National Drug Codes [facility]: [facility] W) are covered. The [facility] your pharmacy is using is not covered. The pharmacy can use NDC(s): [id number]. Please call the pharmacy to process the claim.  Pharmacy Team will call pharmacy to advise of the correct NDC to use for patient.  Thanks!

## 2022-11-04 NOTE — Telephone Encounter (Signed)
Noted  

## 2022-12-04 NOTE — Progress Notes (Deleted)
NEUROLOGY CONSULTATION NOTE  Jean Frank MRN: [id number] DOB: 05/28/2001  Referring provider: Rodman Pickle, NP Primary care provider: Rodman Pickle, NP  Reason for consult:  migraines  Assessment/Plan:   ***   Subjective:  Jean Frank is a 22  year old female with asthma and GERD who presents for migraines.  History supplemented by pediatric neurology and referring provider's notes.  Onset:  *** Location:  *** Quality:  *** Intensity:  ***.  *** denies new headache, thunderclap headache or severe headache that wakes *** from sleep. Aura:  *** Prodrome:  *** Postdrome:  *** Associated symptoms:  ***.  *** denies associated unilateral numbness or weakness. Duration:  *** Frequency:  *** Frequency of abortive medication: *** Triggers:  *** Relieving factors:  *** Activity:  ***  CT head on 10/28/2019 personally reviewed was unremarkable.   Past NSAIDS/analgesics:  naproxen, ketorolac Past abortive triptans:  sumatriptan tab Past abortive ergotamine:  *** Past muscle relaxants:  *** Past anti-emetic:  Zofran, Reglan Past antihypertensive medications:  *** Past antidepressant medications:  *** Past anticonvulsant medications:  topiramate Past anti-CGRP:  *** Past vitamins/Herbal/Supplements:  *** Past antihistamines/decongestants:  Benadryl Other past therapies:  ***  Current NSAIDS/analgesics:  *** Current triptans:  none Current ergotamine:  none Current anti-emetic:  none Current muscle relaxants:  none Current Antihypertensive medications:  none Current Antidepressant medications:  none Current Anticonvulsant medications:  none Current anti-CGRP:  none Current Vitamins/Herbal/Supplements:  none Current Antihistamines/Decongestants:  Zyrtec, Astelin NS, Flonase Other therapy:  none Birth control:  none   Caffeine:  *** Alcohol:  *** Smoker:  *** Diet:  *** Exercise:  *** Depression:  ***; Anxiety:  *** Other pain:  *** Sleep  hygiene:  *** No history of head trauma. Family history of headache:  father      PAST MEDICAL HISTORY: Past Medical History:  Diagnosis Date   Allergy    Asthma    Eczema    GERD (gastroesophageal reflux disease)    Migraine    Prediabetes    Shellfish allergy     PAST SURGICAL HISTORY: Past Surgical History:  Procedure Laterality Date   NO PAST SURGERIES      MEDICATIONS: Current Outpatient Medications on File Prior to Visit  Medication Sig Dispense Refill   albuterol (PROVENTIL HFA) 108 (90 Base) MCG/ACT inhaler Inhale 2 puffs into the lungs every 4 (four) hours as needed for wheezing or shortness of breath. 18 g 1   azelastine (ASTELIN) 0.1 % nasal spray 2 sprays each nostril 1-2 times daily prn. . 30 mL 5   cetirizine (ZYRTEC) 10 MG tablet Take 1 tablet (10 mg total) by mouth daily. 30 tablet 5   desonide (DESOWEN) 0.05 % cream Apply topically 2 (two) times daily as needed. 30 g 5   EPIPEN 2-PAK 0.3 MG/0.3ML SOAJ injection Inject 0.3 mg into the muscle as needed for anaphylaxis. 1 each 1   famotidine (PEPCID) 20 MG tablet Take 1 tablet (20 mg total) by mouth 2 (two) times daily. 60 tablet 5   fluticasone (FLONASE) 50 MCG/ACT nasal spray Place 1-2 sprays into both nostrils daily. 16 g 5   Iron, Ferrous Sulfate, 325 (65 Fe) MG TABS Take 325 mg by mouth daily. (Patient not taking: Reported on 08/27/2022) 90 tablet 1   montelukast (SINGULAIR) 10 MG tablet Take 1 tablet (10 mg total) by mouth at bedtime. 30 tablet 5   NEXIUM 20 MG packet Take 20 mg by mouth daily before breakfast. 30 each  12   SUMAtriptan (IMITREX) 25 MG tablet Take 1 tablet (25 mg total) by mouth every 2 (two) hours as needed for migraine. May repeat in 2 hours if headache persists or recurs. 10 tablet 0   SYMBICORT 160-4.5 MCG/ACT inhaler Inhale 2 puffs into the lungs 2 (two) times daily. 10.2 g 5   triamcinolone ointment (KENALOG) 0.1 % Apply 1 Application topically 2 (two) times daily as needed. 80 g 5    VENTOLIN HFA 108 (90 Base) MCG/ACT inhaler Inhale 2 puffs into the lungs every 4 (four) hours as needed for wheezing or shortness of breath. 18 g 1   No current facility-administered medications on file prior to visit.    ALLERGIES: Allergies  Allergen Reactions   Shellfish Allergy     FAMILY HISTORY: Family History  Problem Relation Age of Onset   Hypertension Mother    Anxiety disorder Mother    Heart disease Father    Hypertension Father    Migraines Father        had migraines but years ago   Allergic rhinitis Brother    Asthma Brother    Diabetes Maternal Grandmother    Migraines Maternal Grandmother    Aneurysm Maternal Grandfather    Diabetes Paternal Grandmother    Angioedema Neg Hx    Eczema Neg Hx    Immunodeficiency Neg Hx    Urticaria Neg Hx    Seizures Neg Hx    Depression Neg Hx    Bipolar disorder Neg Hx    Schizophrenia Neg Hx    ADD / ADHD Neg Hx    Autism Neg Hx     Objective:  *** General: No acute distress.  Patient appears well-groomed.   Head:  Normocephalic/atraumatic Eyes:  fundi examined but not visualized Neck: supple, no paraspinal tenderness, full range of motion Back: No paraspinal tenderness Heart: regular rate and rhythm Lungs: Clear to auscultation bilaterally. Vascular: No carotid bruits. Neurological Exam: Mental status: alert and oriented to person, place, and time, speech fluent and not dysarthric, language intact. Cranial nerves: CN I: not tested CN II: pupils equal, round and reactive to light, visual fields intact CN III, IV, VI:  full range of motion, no nystagmus, no ptosis CN V: facial sensation intact. CN VII: upper and lower face symmetric CN VIII: hearing intact CN IX, X: gag intact, uvula midline CN XI: sternocleidomastoid and trapezius muscles intact CN XII: tongue midline Bulk & Tone: normal, no fasciculations. Motor:  muscle strength 5/5 throughout Sensation:  Pinprick, temperature and vibratory sensation  intact. Deep Tendon Reflexes:  2+ throughout,  toes downgoing.   Finger to nose testing:  Without dysmetria.   Heel to shin:  Without dysmetria.   Gait:  Normal station and stride.  Romberg negative.    Thank you for allowing me to take part in the care of this patient.  Shon Millet, DO  CC: ***

## 2022-12-08 ENCOUNTER — Encounter: Payer: Self-pay | Admitting: Neurology

## 2022-12-08 ENCOUNTER — Ambulatory Visit: Payer: Medicaid Other | Admitting: Neurology

## 2023-01-15 ENCOUNTER — Encounter: Payer: Self-pay | Admitting: Nurse Practitioner

## 2023-01-15 ENCOUNTER — Ambulatory Visit (INDEPENDENT_AMBULATORY_CARE_PROVIDER_SITE_OTHER): Payer: Medicaid Other | Admitting: Nurse Practitioner

## 2023-01-15 VITALS — BP 124/70 | HR 95 | Temp 98.7°F | Ht 68.0 in | Wt 205.0 lb

## 2023-01-15 DIAGNOSIS — N921 Excessive and frequent menstruation with irregular cycle: Secondary | ICD-10-CM | POA: Diagnosis not present

## 2023-01-15 DIAGNOSIS — L2084 Intrinsic (allergic) eczema: Secondary | ICD-10-CM | POA: Diagnosis not present

## 2023-01-15 MED ORDER — NORETHIN ACE-ETH ESTRAD-FE 1-20 MG-MCG PO TABS: 1.0000 | ORAL_TABLET | Freq: Every day | ORAL | 3 refills | Status: AC

## 2023-01-15 NOTE — Progress Notes (Signed)
Acute Office Visit  Subjective:     Patient ID: Jean Frank, female    DOB: June 09, 2001, 22 y.o.   MRN: [id number]  Chief Complaint  Patient presents with   Menorrhagia     Started last night and had menstrual cycle 2 weeks prior, light headed during cycles, request a referral to dermatology    HPI Patient is in today for irregular periods.   She has been having menstrual periods every 2 weeks and lasts 8-9 days. Her menses have been very heavy. She has been having dizziness and light-headed. She states her mom and sisters have had heavy periods and needed blood transfusions and hysterectomies. She states that she has tried 2 different birth control pills in the past, but made her feel weird, so she stopped them. She has not been taking her iron supplement every day, only when she feels like her iron is low.   She was going to dermatology in the past and her they don't accept her insurance. She has a history of eczema and has been having skin peeling on her face, back, and legs. She describes it as burning as well. She has been using desonide cream.   ROS See pertinent positives and negatives per HPI.     Objective:    BP 124/70 (BP Location: Left Arm)   Pulse 95   Temp 98.7 F (37.1 C)   Ht 5\' 8"  (1.727 m)   Wt 205 lb (93 kg)   LMP 01/14/2023 (Exact Date)   SpO2 99%   BMI 31.17 kg/m    Physical Exam Vitals and nursing note reviewed.  Constitutional:      General: She is not in acute distress.    Appearance: Normal appearance.  HENT:     Head: Normocephalic.  Eyes:     Conjunctiva/sclera: Conjunctivae normal.  Cardiovascular:     Rate and Rhythm: Normal rate and regular rhythm.     Pulses: Normal pulses.     Heart sounds: Normal heart sounds.  Pulmonary:     Effort: Pulmonary effort is normal.     Breath sounds: Normal breath sounds.  Musculoskeletal:     Cervical back: Normal range of motion.  Skin:    General: Skin is warm.  Neurological:      General: No focal deficit present.     Mental Status: She is alert and oriented to person, place, and time.  Psychiatric:        Mood and Affect: Mood normal.        Behavior: Behavior normal.        Thought Content: Thought content normal.        Judgment: Judgment normal.      Assessment & Plan:   Problem List Items Addressed This Visit       Musculoskeletal and Integument   Intrinsic atopic dermatitis    Chronic, ongoing. She has been having a flare-up of skin peeling on her face, back and legs. She was going to dermatology, however they do not take her current insurance.  Referral placed to another dermatologist.      Relevant Orders   Ambulatory referral to Dermatology     Other   Menorrhagia with irregular cycle - Primary    Chronic, ongoing. She has been having heavy menstrual periods every 2 weeks for the past several months.  She is starting to feel lightheaded at times.  Will check CBC and iron panel today.  Encouraged her to take her iron supplement  daily.  Will have her start Loestrin FE daily to help regulate menstrual cycles. Discussed ER precautions. Follow-up in 3 months.       Relevant Orders   CBC   Iron, TIBC and Ferritin Panel    Meds ordered this encounter  Medications   norethindrone-ethinyl estradiol-FE (LOESTRIN FE 1/20) 1-20 MG-MCG tablet    Sig: Take 1 tablet by mouth daily.    Dispense:  84 tablet    Refill:  3    Return in about 3 months (around 04/17/2023) for CPE.  Gerre Scull, NP

## 2023-01-15 NOTE — Assessment & Plan Note (Signed)
Chronic, ongoing. She has been having a flare-up of skin peeling on her face, back and legs. She was going to dermatology, however they do not take her current insurance.  Referral placed to another dermatologist.

## 2023-01-15 NOTE — Assessment & Plan Note (Addendum)
Chronic, ongoing. She has been having heavy menstrual periods every 2 weeks for the past several months.  She is starting to feel lightheaded at times.  Will check CBC and iron panel today.  Encouraged her to take her iron supplement daily.  Will have her start Loestrin FE daily to help regulate menstrual cycles. Discussed ER precautions. Follow-up in 3 months.

## 2023-01-15 NOTE — Patient Instructions (Signed)
It was great to see you!  Start birth control pill once a day at the same time every day.  Try to take your iron pill every day.   I have placed a referral to dermatology.   Let's follow-up in 3 months, sooner if you have concerns.  If a referral was placed today, you will be contacted for an appointment. Please note that routine referrals can sometimes take up to 3-4 weeks to process. Please call our office if you haven't heard anything after this time frame.  Take care,  Rodman Pickle, NP

## 2023-01-16 ENCOUNTER — Telehealth: Payer: Self-pay | Admitting: Nurse Practitioner

## 2023-01-16 ENCOUNTER — Encounter: Payer: Self-pay | Admitting: Nurse Practitioner

## 2023-01-16 LAB — IRON,TIBC AND FERRITIN PANEL
%SAT: 3 % (calc) — ABNORMAL LOW (ref 15–45)
Ferritin: 5 ng/mL — ABNORMAL LOW (ref 16–154)
Iron: 16 ug/dL — ABNORMAL LOW (ref 27–164)
TIBC: 499 mcg/dL (calc) — ABNORMAL HIGH (ref 271–448)

## 2023-01-16 LAB — CBC
HCT: 33.4 % — ABNORMAL LOW (ref 36.0–49.0)
Hemoglobin: 10.3 g/dL — ABNORMAL LOW (ref 12.0–16.0)
MCHC: 31 g/dL (ref 31.0–37.0)
MCV: 68 fl — ABNORMAL LOW (ref 78.0–98.0)
Platelets: 322 10*3/uL (ref 150.0–575.0)
RBC: 4.91 Mil/uL (ref 3.80–5.70)
RDW: 17 % — ABNORMAL HIGH (ref 11.4–15.5)
WBC: 8 10*3/uL (ref 4.5–13.5)

## 2023-01-16 NOTE — Telephone Encounter (Signed)
Pt is anxious about her labs from 5/30. When you receive please call.

## 2023-01-16 NOTE — Telephone Encounter (Signed)
I will call once we receive results.

## 2023-03-05 ENCOUNTER — Telehealth: Payer: Medicaid Other | Admitting: Nurse Practitioner

## 2023-03-05 ENCOUNTER — Encounter: Payer: Self-pay | Admitting: Nurse Practitioner

## 2023-03-05 VITALS — Ht 68.0 in | Wt 202.0 lb

## 2023-03-05 DIAGNOSIS — J029 Acute pharyngitis, unspecified: Secondary | ICD-10-CM

## 2023-03-05 DIAGNOSIS — N921 Excessive and frequent menstruation with irregular cycle: Secondary | ICD-10-CM

## 2023-03-05 DIAGNOSIS — D509 Iron deficiency anemia, unspecified: Secondary | ICD-10-CM

## 2023-03-05 MED ORDER — IRON (FERROUS SULFATE) 325 (65 FE) MG PO TABS: 325.0000 mg | ORAL_TABLET | Freq: Every day | ORAL | 1 refills | Status: AC

## 2023-03-05 NOTE — Progress Notes (Signed)
[facility] [address] [place] [address] Dept: [phone] Dept Fax: [phone]  Virtual Video Visit  I connected with Jean Frank on 03/05/23 at  4:00 PM EDT by a video enabled telemedicine application and verified that I am speaking with the correct person using two identifiers.  Location patient: Home Location provider: Clinic Persons participating in the virtual visit: Patient; Rodman Pickle, NP; Malena Peer, CMA  I discussed the limitations of evaluation and management by telemedicine and the availability of in person appointments. The patient expressed understanding and agreed to proceed.  Chief Complaint  Patient presents with   Menstrual Problem    Heavy menstrual cycle and irregular, Rx refills    SUBJECTIVE:  HPI: Jean Frank is a 22 y.o. female who presents to follow-up on heavy menses with irregular cycles.  Last visit she was started on loestrin fe to help with her menses and cycle.  She states that this is helping her periods are lighter and more regular.  She states that 2 days ago she did not have a menstrual napkin on her and so she ended up using a tampon.  She said that her flow was really light and when she took it out it caused a lot of pain.  She was then having pain in her vaginal area, abdomen, and she also developed a headache and sore throat.  She is concern for toxic shock syndrome.  She denies fevers, rash, nausea, vomiting, diarrhea.  Has taken Tylenol and throat numbing spray which helps for short period of time and then the pain will return.   Patient Active Problem List   Diagnosis Date Noted   Menorrhagia with irregular cycle 01/15/2023   Elevated blood pressure reading 04/16/2022   Anemia 03/19/2022   Breast pain 02/12/2022   Chronic midline thoracic back pain 12/18/2021   Prediabetes 12/18/2021   Not well controlled moderate persistent asthma 06/21/2021    Seasonal and perennial allergic rhinitis 06/21/2021   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   Intrinsic atopic dermatitis 04/19/2021   Migraine without aura and without status migrainosus, not intractable 11/01/2019   Asthma 08/17/2019   Chronic idiopathic urticaria 08/17/2019   Gastroesophageal reflux disease 07/20/2017   Allergic rhinitis 07/30/2016   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    Past Surgical History:  Procedure Laterality Date   NO PAST SURGERIES      Family History  Problem Relation Age of Onset   Hypertension Mother    Anxiety disorder Mother    Heart disease Father    Hypertension Father    Migraines Father        had migraines but years ago   Allergic rhinitis Brother    Asthma Brother    Diabetes Maternal Grandmother    Migraines Maternal Grandmother    Aneurysm Maternal Grandfather    Diabetes Paternal Grandmother    Angioedema Neg Hx    Eczema Neg Hx    Immunodeficiency Neg Hx    Urticaria Neg Hx    Seizures Neg Hx    Depression Neg Hx    Bipolar disorder Neg Hx    Schizophrenia Neg Hx    ADD / ADHD Neg Hx    Autism Neg Hx     Social History   Tobacco Use   Smoking status: Never    Passive exposure: Never   Smokeless tobacco: Never  Vaping Use   Vaping status: Never Used  Substance Use Topics   Alcohol use:  No   Drug use: No     Current Outpatient Medications:    albuterol (PROVENTIL HFA) 108 (90 Base) MCG/ACT inhaler, Inhale 2 puffs into the lungs every 4 (four) hours as needed for wheezing or shortness of breath., Disp: 18 g, Rfl: 1   azelastine (ASTELIN) 0.1 % nasal spray, 2 sprays each nostril 1-2 times daily prn. ., Disp: 30 mL, Rfl: 5   cetirizine (ZYRTEC) 10 MG tablet, Take 1 tablet (10 mg total) by mouth daily., Disp: 30 tablet, Rfl: 5   desonide (DESOWEN) 0.05 % cream, Apply topically 2 (two) times daily as needed., Disp: 30 g, Rfl: 5   EPIPEN 2-PAK 0.3 MG/0.3ML SOAJ injection, Inject 0.3 mg into the muscle as needed for  anaphylaxis., Disp: 1 each, Rfl: 1   famotidine (PEPCID) 20 MG tablet, Take 1 tablet (20 mg total) by mouth 2 (two) times daily., Disp: 60 tablet, Rfl: 5   fluticasone (FLONASE) 50 MCG/ACT nasal spray, Place 1-2 sprays into both nostrils daily., Disp: 16 g, Rfl: 5   montelukast (SINGULAIR) 10 MG tablet, Take 1 tablet (10 mg total) by mouth at bedtime., Disp: 30 tablet, Rfl: 5   norethindrone-ethinyl estradiol-FE (LOESTRIN FE 1/20) 1-20 MG-MCG tablet, Take 1 tablet by mouth daily., Disp: 84 tablet, Rfl: 3   SUMAtriptan (IMITREX) 25 MG tablet, Take 1 tablet (25 mg total) by mouth every 2 (two) hours as needed for migraine. May repeat in 2 hours if headache persists or recurs., Disp: 10 tablet, Rfl: 0   SYMBICORT 160-4.5 MCG/ACT inhaler, Inhale 2 puffs into the lungs 2 (two) times daily., Disp: 10.2 g, Rfl: 5   triamcinolone ointment (KENALOG) 0.1 %, Apply 1 Application topically 2 (two) times daily as needed., Disp: 80 g, Rfl: 5   VENTOLIN HFA 108 (90 Base) MCG/ACT inhaler, Inhale 2 puffs into the lungs every 4 (four) hours as needed for wheezing or shortness of breath., Disp: 18 g, Rfl: 1   Iron, Ferrous Sulfate, 325 (65 Fe) MG TABS, Take 325 mg by mouth daily., Disp: 90 tablet, Rfl: 1   NEXIUM 20 MG packet, Take 20 mg by mouth daily before breakfast. (Patient not taking: Reported on 03/05/2023), Disp: 30 each, Rfl: 12  Allergies  Allergen Reactions   Shellfish Allergy     ROS: See pertinent positives and negatives per HPI.  OBSERVATIONS/OBJECTIVE:  VITALS per patient if applicable: Today's Vitals   03/05/23 1603  Weight: 202 lb (91.6 kg)  Height: 5\' 8"  (1.727 m)   Body mass index is 30.71 kg/m.    GENERAL: Alert and oriented. Appears well and in no acute distress.  HEENT: Atraumatic. Conjunctiva clear. No obvious abnormalities on inspection of external nose and ears.  NECK: Normal movements of the head and neck.  LUNGS: On inspection, no signs of respiratory distress. Breathing  rate appears normal. No obvious gross SOB, gasping or wheezing, and no conversational dyspnea.  CV: No obvious cyanosis.  MS: Moves all visible extremities without noticeable abnormality.  PSYCH/NEURO: Pleasant and cooperative. No obvious depression or anxiety. Speech and thought processing grossly intact.  ASSESSMENT AND PLAN:  Problem List Items Addressed This Visit       Other   Anemia    She is currently taking an iron pill 325 mg daily.  Refill sent to the pharmacy.  Check CBC and iron panel and adjust regimen based on results.      Relevant Medications   Iron, Ferrous Sulfate, 325 (65 Fe) MG TABS   Other  Relevant Orders   CBC with Differential/Platelet   Iron, TIBC and Ferritin Panel   Menorrhagia with irregular cycle - Primary    Chronic, stable.  Symptoms have improved since starting Loestrin FE.  Will have her continue this regularly.  Discussed using tampons based on flow.      Relevant Orders   Comprehensive metabolic panel   CBC with Differential/Platelet   Iron, TIBC and Ferritin Panel   Other Visit Diagnoses     Sore throat       Sore throat and headache. Most likely viral, however with concern for recent tampon use, will check CMP and CBC. Cont. tylenol prn.        I discussed the assessment and treatment plan with the patient. The patient was provided an opportunity to ask questions and all were answered. The patient agreed with the plan and demonstrated an understanding of the instructions.   The patient was advised to call back or seek an in-person evaluation if the symptoms worsen or if the condition fails to improve as anticipated.   Gerre Scull, NP

## 2023-03-05 NOTE — Assessment & Plan Note (Signed)
Chronic, stable.  Symptoms have improved since starting Loestrin FE.  Will have her continue this regularly.  Discussed using tampons based on flow.

## 2023-03-05 NOTE — Assessment & Plan Note (Signed)
She is currently taking an iron pill 325 mg daily.  Refill sent to the pharmacy.  Check CBC and iron panel and adjust regimen based on results.

## 2023-03-05 NOTE — Patient Instructions (Signed)
It was great to see you!  You are scheduled for a lab visit at 11am. Keep taking tylenol as needed.   Let's follow-up if symptoms worsen or don't improve.   Take care,  Rodman Pickle, NP

## 2023-03-06 ENCOUNTER — Other Ambulatory Visit: Payer: Medicaid Other

## 2023-03-06 NOTE — Addendum Note (Signed)
Addended by: Rodman Pickle A on: 03/06/2023 08:15 AM   Modules accepted: Orders

## 2023-04-02 ENCOUNTER — Encounter (INDEPENDENT_AMBULATORY_CARE_PROVIDER_SITE_OTHER): Payer: Self-pay

## 2023-04-20 NOTE — Progress Notes (Deleted)
There were no vitals taken for this visit.   Subjective:    Patient ID: Jean Frank, female    DOB: 12-24-2000, 22 y.o.   MRN: [id number]  CC: No chief complaint on file.   HPI: Jean Frank is a 22 y.o. female presenting on 04/21/2023 for comprehensive medical examination. Current medical complaints include:{Blank single:19197::"none","***"}  She currently lives with: Menopausal Symptoms: {Blank single:19197::"yes","no"}  Depression and Anxiety Screen done today and results listed below:     07/31/2022    4:03 PM 07/17/2022    1:26 PM 12/18/2021   11:32 AM  Depression screen PHQ 2/9  Decreased Interest 0 0 0  Down, Depressed, Hopeless 0 0 0  PHQ - 2 Score 0 0 0  Altered sleeping   3  Tired, decreased energy   0  Change in appetite   0  Feeling bad or failure about yourself    0  Trouble concentrating   0  Moving slowly or fidgety/restless   0  Suicidal thoughts   0  PHQ-9 Score   3  Difficult doing work/chores   Not difficult at all      12/18/2021   11:32 AM  GAD 7 : Generalized Anxiety Score  Nervous, Anxious, on Edge 0  Control/stop worrying 0  Worry too much - different things 0  Trouble relaxing 0  Restless 0  Easily annoyed or irritable 0  Afraid - awful might happen 0  Total GAD 7 Score 0  Anxiety Difficulty Not difficult at all    The patient {has/does not have:19849} a history of falls. I {did/did not:19850} complete a risk assessment for falls. A plan of care for falls {was/was not:19852} documented.   Past Medical History:  Past Medical History:  Diagnosis Date   Allergy    Asthma    Eczema    GERD (gastroesophageal reflux disease)    Migraine    Prediabetes    Shellfish allergy     Surgical History:  Past Surgical History:  Procedure Laterality Date   NO PAST SURGERIES      Medications:  Current Outpatient Medications on File Prior to Visit  Medication Sig   albuterol (PROVENTIL HFA) 108 (90 Base) MCG/ACT inhaler Inhale 2  puffs into the lungs every 4 (four) hours as needed for wheezing or shortness of breath.   azelastine (ASTELIN) 0.1 % nasal spray 2 sprays each nostril 1-2 times daily prn. .   cetirizine (ZYRTEC) 10 MG tablet Take 1 tablet (10 mg total) by mouth daily.   desonide (DESOWEN) 0.05 % cream Apply topically 2 (two) times daily as needed.   EPIPEN 2-PAK 0.3 MG/0.3ML SOAJ injection Inject 0.3 mg into the muscle as needed for anaphylaxis.   famotidine (PEPCID) 20 MG tablet Take 1 tablet (20 mg total) by mouth 2 (two) times daily.   fluticasone (FLONASE) 50 MCG/ACT nasal spray Place 1-2 sprays into both nostrils daily.   Iron, Ferrous Sulfate, 325 (65 Fe) MG TABS Take 325 mg by mouth daily.   montelukast (SINGULAIR) 10 MG tablet Take 1 tablet (10 mg total) by mouth at bedtime.   NEXIUM 20 MG packet Take 20 mg by mouth daily before breakfast. (Patient not taking: Reported on 03/05/2023)   norethindrone-ethinyl estradiol-FE (LOESTRIN FE 1/20) 1-20 MG-MCG tablet Take 1 tablet by mouth daily.   SUMAtriptan (IMITREX) 25 MG tablet Take 1 tablet (25 mg total) by mouth every 2 (two) hours as needed for migraine. May repeat in 2 hours if headache  persists or recurs.   SYMBICORT 160-4.5 MCG/ACT inhaler Inhale 2 puffs into the lungs 2 (two) times daily.   triamcinolone ointment (KENALOG) 0.1 % Apply 1 Application topically 2 (two) times daily as needed.   VENTOLIN HFA 108 (90 Base) MCG/ACT inhaler Inhale 2 puffs into the lungs every 4 (four) hours as needed for wheezing or shortness of breath.   No current facility-administered medications on file prior to visit.    Allergies:  Allergies  Allergen Reactions   Shellfish Allergy     Social History:  Social History   Socioeconomic History   Marital status: Single    Spouse name: Not on file   Number of children: Not on file   Years of education: Not on file   Highest education level: Not on file  Occupational History   Not on file  Tobacco Use   Smoking  status: Never    Passive exposure: Never   Smokeless tobacco: Never  Vaping Use   Vaping status: Never Used  Substance and Sexual Activity   Alcohol use: No   Drug use: No   Sexual activity: Yes    Birth control/protection: None  Other Topics Concern   Not on file  Social History Narrative   Not on file   Social Determinants of Health   Financial Resource Strain: Not on file  Food Insecurity: Not on file  Transportation Needs: Not on file  Physical Activity: Not on file  Stress: Not on file  Social Connections: Not on file  Intimate Partner Violence: Not on file   Social History   Tobacco Use  Smoking Status Never   Passive exposure: Never  Smokeless Tobacco Never   Social History   Substance and Sexual Activity  Alcohol Use No    Family History:  Family History  Problem Relation Age of Onset   Hypertension Mother    Anxiety disorder Mother    Heart disease Father    Hypertension Father    Migraines Father        had migraines but years ago   Allergic rhinitis Brother    Asthma Brother    Diabetes Maternal Grandmother    Migraines Maternal Grandmother    Aneurysm Maternal Grandfather    Diabetes Paternal Grandmother    Angioedema Neg Hx    Eczema Neg Hx    Immunodeficiency Neg Hx    Urticaria Neg Hx    Seizures Neg Hx    Depression Neg Hx    Bipolar disorder Neg Hx    Schizophrenia Neg Hx    ADD / ADHD Neg Hx    Autism Neg Hx     Past medical history, surgical history, medications, allergies, family history and social history reviewed with patient today and changes made to appropriate areas of the chart.   ROS All other ROS negative except what is listed above and in the HPI.      Objective:    There were no vitals taken for this visit.  Wt Readings from Last 3 Encounters:  03/05/23 202 lb (91.6 kg) (98%, Z= 1.97)*  01/15/23 205 lb (93 kg) (98%, Z= 2.01)*  08/27/22 208 lb 12.8 oz (94.7 kg) (98%, Z= 2.07)*   * Growth percentiles are based  on CDC (Girls, 2-20 Years) data.    Physical Exam  Results for orders placed or performed in visit on 01/15/23  CBC  Result Value Ref Range   WBC 8.0 4.5 - 13.5 K/uL   RBC 4.91 3.80 -  5.70 Mil/uL   Platelets 322.0 150.0 - 575.0 K/uL   Hemoglobin 10.3 (L) 12.0 - 16.0 g/dL   HCT 16.1 (L) 09.6 - 04.5 %   MCV 68.0 Repeated and verified X2. (L) 78.0 - 98.0 fl   MCHC 31.0 31.0 - 37.0 g/dL   RDW 40.9 (H) 81.1 - 91.4 %  Iron, TIBC and Ferritin Panel  Result Value Ref Range   Iron 16 (L) 27 - 164 mcg/dL   TIBC 782 (H) 956 - 213 mcg/dL (calc)   %SAT 3 (L) 15 - 45 % (calc)   Ferritin 5 (L) 16 - 154 ng/mL      Assessment & Plan:   Problem List Items Addressed This Visit   None    Follow up plan: No follow-ups on file.   LABORATORY TESTING:  - Pap smear: not applicable  IMMUNIZATIONS:   - Tdap: Tetanus vaccination status reviewed: last tetanus booster within 10 years. - Influenza: {Blank single:19197::"Up to date","Administered today","Postponed to flu season","Refused","Given elsewhere"} - Pneumovax: Not applicable - Prevnar: Not applicable - HPV: {Blank single:19197::"Up to date","Administered today","Not applicable","Refused","Given elsewhere"} - Shingrix vaccine: Not applicable  SCREENING: -Mammogram: Not applicable  - Colonoscopy: Not applicable  - Bone Density: Not applicable   PATIENT COUNSELING:   Advised to take 1 mg of folate supplement per day if capable of pregnancy.   Sexuality: Discussed sexually transmitted diseases, partner selection, use of condoms, avoidance of unintended pregnancy  and contraceptive alternatives.   Advised to avoid cigarette smoking.  I discussed with the patient that most people either abstain from alcohol or drink within safe limits (<=14/week and <=4 drinks/occasion for males, <=7/weeks and <= 3 drinks/occasion for females) and that the risk for alcohol disorders and other health effects rises proportionally with the number of drinks  per week and how often a drinker exceeds daily limits.  Discussed cessation/primary prevention of drug use and availability of treatment for abuse.   Diet: Encouraged to adjust caloric intake to maintain  or achieve ideal body weight, to reduce intake of dietary saturated fat and total fat, to limit sodium intake by avoiding high sodium foods and not adding table salt, and to maintain adequate dietary potassium and calcium preferably from fresh fruits, vegetables, and low-fat dairy products.    stressed the importance of regular exercise  Injury prevention: Discussed safety belts, safety helmets, smoke detector, smoking near bedding or upholstery.   Dental health: Discussed importance of regular tooth brushing, flossing, and dental visits.    NEXT PREVENTATIVE PHYSICAL DUE IN 1 YEAR. No follow-ups on file.  Kelwin Gibler A Clem Wisenbaker

## 2023-04-21 ENCOUNTER — Telehealth: Payer: Self-pay | Admitting: Nurse Practitioner

## 2023-04-21 ENCOUNTER — Encounter: Payer: Medicaid Other | Admitting: Nurse Practitioner

## 2023-04-21 NOTE — Telephone Encounter (Signed)
9.3.24 no show /pt has medicaid/ did not send letter

## 2023-04-23 ENCOUNTER — Encounter: Payer: Self-pay | Admitting: Nurse Practitioner

## 2023-04-23 ENCOUNTER — Ambulatory Visit: Payer: Medicaid Other | Admitting: Family Medicine

## 2023-04-23 NOTE — Telephone Encounter (Signed)
Noted  

## 2023-04-23 NOTE — Telephone Encounter (Signed)
3rd no show, dismissal letter sent via mail  08/21/2022, 09/24/2022, and 04/21/2023  no shows

## 2023-05-04 ENCOUNTER — Other Ambulatory Visit: Payer: Self-pay

## 2023-05-04 ENCOUNTER — Ambulatory Visit (INDEPENDENT_AMBULATORY_CARE_PROVIDER_SITE_OTHER): Payer: Medicaid Other | Admitting: Family Medicine

## 2023-05-04 ENCOUNTER — Encounter: Payer: Self-pay | Admitting: Family Medicine

## 2023-05-04 VITALS — BP 130/80 | HR 100 | Temp 97.6°F | Resp 16 | Wt 206.7 lb

## 2023-05-04 DIAGNOSIS — T7800XA Anaphylactic reaction due to unspecified food, initial encounter: Secondary | ICD-10-CM

## 2023-05-04 DIAGNOSIS — J302 Other seasonal allergic rhinitis: Secondary | ICD-10-CM

## 2023-05-04 DIAGNOSIS — R040 Epistaxis: Secondary | ICD-10-CM | POA: Insufficient documentation

## 2023-05-04 DIAGNOSIS — H1013 Acute atopic conjunctivitis, bilateral: Secondary | ICD-10-CM

## 2023-05-04 DIAGNOSIS — L501 Idiopathic urticaria: Secondary | ICD-10-CM | POA: Diagnosis not present

## 2023-05-04 DIAGNOSIS — J4540 Moderate persistent asthma, uncomplicated: Secondary | ICD-10-CM | POA: Diagnosis not present

## 2023-05-04 DIAGNOSIS — J3089 Other allergic rhinitis: Secondary | ICD-10-CM | POA: Diagnosis not present

## 2023-05-04 DIAGNOSIS — L2084 Intrinsic (allergic) eczema: Secondary | ICD-10-CM

## 2023-05-04 DIAGNOSIS — H1010 Acute atopic conjunctivitis, unspecified eye: Secondary | ICD-10-CM

## 2023-05-04 DIAGNOSIS — K219 Gastro-esophageal reflux disease without esophagitis: Secondary | ICD-10-CM

## 2023-05-04 DIAGNOSIS — T783XXA Angioneurotic edema, initial encounter: Secondary | ICD-10-CM | POA: Insufficient documentation

## 2023-05-04 MED ORDER — TRIAMCINOLONE ACETONIDE 0.1 % EX OINT: 1.0000 | TOPICAL_OINTMENT | Freq: Two times a day (BID) | CUTANEOUS | 5 refills | Status: DC | PRN

## 2023-05-04 MED ORDER — DESONIDE 0.05 % EX OINT: 1.0000 | TOPICAL_OINTMENT | Freq: Two times a day (BID) | CUTANEOUS | 0 refills | Status: DC

## 2023-05-04 MED ORDER — CETIRIZINE HCL 10 MG PO TABS: 10.0000 mg | ORAL_TABLET | Freq: Every day | ORAL | 5 refills | Status: DC

## 2023-05-04 MED ORDER — CROMOLYN SODIUM 4 % OP SOLN: 2.0000 [drp] | Freq: Four times a day (QID) | OPHTHALMIC | 5 refills | Status: DC

## 2023-05-04 MED ORDER — SYMBICORT 160-4.5 MCG/ACT IN AERO: 2.0000 | INHALATION_SPRAY | Freq: Two times a day (BID) | RESPIRATORY_TRACT | 5 refills | Status: DC

## 2023-05-04 MED ORDER — FAMOTIDINE 20 MG PO TABS: 20.0000 mg | ORAL_TABLET | Freq: Two times a day (BID) | ORAL | 5 refills | Status: DC

## 2023-05-04 MED ORDER — ALBUTEROL SULFATE HFA 108 (90 BASE) MCG/ACT IN AERS: 2.0000 | INHALATION_SPRAY | RESPIRATORY_TRACT | 1 refills | Status: DC | PRN

## 2023-05-04 MED ORDER — FAMOTIDINE 20 MG PO TABS: 20.0000 mg | ORAL_TABLET | Freq: Two times a day (BID) | ORAL | 5 refills | Status: AC | PRN

## 2023-05-04 MED ORDER — EPIPEN 2-PAK 0.3 MG/0.3ML IJ SOAJ: 0.3000 mg | INTRAMUSCULAR | 1 refills | Status: DC | PRN

## 2023-05-04 MED ORDER — MONTELUKAST SODIUM 10 MG PO TABS: 10.0000 mg | ORAL_TABLET | Freq: Every day | ORAL | 5 refills | Status: DC

## 2023-05-04 NOTE — Progress Notes (Addendum)
[address] [place] [address] Dept: [phone]  FOLLOW UP NOTE  Patient ID: Jean Frank, female    DOB: 2001/07/25  Age: 22 y.o. MRN: [id number] Date of Office Visit: 05/04/2023  Assessment  Chief Complaint: Allergy Testing (Patient took Claritin Saturday morning.)  HPI Jean Frank is a 22 year old female who presents to the clinic for follow-up visit.  She was last seen in this clinic on 07/08/2022 by Thermon Leyland, FNP, for evaluation of asthma, allergic rhinitis, allergic conjunctivitis, atopic dermatitis, urticaria, reflux, and food allergy to shellfish.    At today's visit, she reports her asthma has been moderately well-controlled with symptoms including shortness of breath with activity, wheeze at night, and dry cough for about one week. She denied fever, chills, sweats and sick contacts. She reports that she ran out of Symbicort 160 and montelukast several months ago and continues to use albuterol 2-3 times a week.  She reports that she rarely uses albuterol via nebulizer.    Allergic rhinitis is reported as poorly controlled with intermittent symptoms occurring over the last several weeks including clear rhinorrhea, nasal congestion, sneezing, and postnasal drainage.  She does report intermittent sore throat that comes and goes every couple of days.  She continues Claritin 10 mg once a day as needed and Flonase as needed.  She is not currently using azelastine or nasal saline rinses.  She does report that she used Afrin with illness on 2 separate occasions this year with relief of symptoms.  Her last environmental allergy skin testing was on 04/19/2021 and was positive to pollens, dust mite, dog, and cockroach.    Allergic conjunctivitis is reported as moderately well-controlled with symptoms including occasional red and itchy eyes.  She continues an over-the-counter allergy eyedrop with only mild relief of symptoms.    She reports epistaxis occurring 4 times over the  last year with nasal bleeding from the right nostril more than the left.  She does report that each episode resolves in under 5 minutes.  She reports episodes of epistaxis are not related to Flonase use.  She reports intermittent hives occurring after she has been playing in the grass or during high pollen season.  She denies cardiopulmonary or gastrointestinal symptoms with the hives.  She reports hives resolve quickly with Benadryl.  Atopic dermatitis is reported as moderately well-controlled with dry, rough, and occasionally itchy areas on her face, legs, and back.  She continues a twice a day moisturizing routine and occasionally uses triamcinolone with moderate relief of symptoms.  She reports that she does have a dermatology appointment coming up on 05/12/2023.    Reflux is reported as moderately well controlled with recent dietary changes. She continues occasional use of famotidine with relief of symptoms.   She continues to avoid shellfish with no accidental ingestion or EpiPen use since her last visit to this clinic.  She does report lip swelling after eating a granola bar which likely contained peanuts for which she took Benadryl with relief of symptoms over about 2 hours.  She denies cardiopulmonary or gastrointestinal symptoms with the lip swelling.  Recently she ate ice cream with pecans and experienced throat itching.  She denies any cardiopulmonary or gastrointestinal symptoms with the throat itching.  She denies new foods, medications, personal care products, or insect stings. She does not have a family or personal history of lip swelling. Her last food allergy testing was on 04/19/2021 and was positive to shrimp.  Epinephrine autoinjector set is out of date  and will be reordered at today's visit.  Her current medications are listed in the chart.   Drug Allergies:  Allergies  Allergen Reactions   Shellfish Allergy     Physical Exam: BP 130/80   Pulse 100   Temp 97.6 F (36.4 C)  (Temporal)   Resp 16   Wt 206 lb 11.2 oz (93.8 kg)   SpO2 100%   BMI 31.43 kg/m    Physical Exam Vitals reviewed.  Constitutional:      Appearance: Normal appearance.  HENT:     Head: Normocephalic and atraumatic.     Right Ear: Tympanic membrane normal.     Left Ear: Tympanic membrane normal.     Nose:     Comments: Bilateral nares edematous and pale with thick clear nasal drainage noted.  Pharynx erythematous with no exudate.  Ears normal.  Eyes normal. Eyes:     Conjunctiva/sclera: Conjunctivae normal.  Cardiovascular:     Rate and Rhythm: Normal rate and regular rhythm.     Heart sounds: Normal heart sounds. No murmur heard. Pulmonary:     Effort: Pulmonary effort is normal.     Breath sounds: Normal breath sounds.     Comments: Lungs clear to auscultation Musculoskeletal:        General: Normal range of motion.     Cervical back: Normal range of motion and neck supple.  Skin:    General: Skin is warm and dry.     Comments: Some dry patches noted on her face near her eyebrows.   Neurological:     Mental Status: She is alert and oriented to person, place, and time.  Psychiatric:        Mood and Affect: Mood normal.        Behavior: Behavior normal.        Thought Content: Thought content normal.        Judgment: Judgment normal.     Diagnostics: FVC 3.45 which is 92% of predicted value, FEV1 2.79 which is 85% of predicted value.  Spirometry indicates normal ventilatory function.  Assessment and Plan: 1. Not well controlled moderate persistent asthma   2. Seasonal and perennial allergic rhinitis   3. Seasonal allergic conjunctivitis   4. Chronic idiopathic urticaria   5. Intrinsic atopic dermatitis   6. Gastroesophageal reflux disease, unspecified whether esophagitis present   7. Allergy with anaphylaxis due to food   8. Epistaxis     Meds ordered this encounter  Medications   albuterol (PROVENTIL HFA) 108 (90 Base) MCG/ACT inhaler    Sig: Inhale 2 puffs  into the lungs every 4 (four) hours as needed for wheezing or shortness of breath.    Dispense:  18 g    Refill:  1    One for home and school.   cetirizine (ZYRTEC) 10 MG tablet    Sig: Take 1 tablet (10 mg total) by mouth daily.    Dispense:  30 tablet    Refill:  5   EPIPEN 2-PAK 0.3 MG/0.3ML SOAJ injection    Sig: Inject 0.3 mg into the muscle as needed for anaphylaxis.    Dispense:  1 each    Refill:  1   montelukast (SINGULAIR) 10 MG tablet    Sig: Take 1 tablet (10 mg total) by mouth at bedtime.    Dispense:  30 tablet    Refill:  5   triamcinolone ointment (KENALOG) 0.1 %    Sig: Apply 1 Application topically 2 (two) times  daily as needed.    Dispense:  80 g    Refill:  5   SYMBICORT 160-4.5 MCG/ACT inhaler    Sig: Inhale 2 puffs into the lungs 2 (two) times daily.    Dispense:  10.2 g    Refill:  5   famotidine (PEPCID) 20 MG tablet    Sig: Take 1 tablet (20 mg total) by mouth 2 (two) times daily.    Dispense:  60 tablet    Refill:  5   cromolyn (OPTICROM) 4 % ophthalmic solution    Sig: Place 2 drops into both eyes 4 (four) times daily.    Dispense:  10 mL    Refill:  5   desonide (DESOWEN) 0.05 % ointment    Sig: Apply 1 Application topically 2 (two) times daily.    Dispense:  15 g    Refill:  0    Patient Instructions  Asthma Not well-controlled Restart Symbicort 160 to 2 puffs twice a day with a spacer to prevent cough or wheeze.  Restart montelukast 10 mg once a day to prevent cough or wheeze Continue albuterol 2 puffs every 4 hours as needed for cough or wheeze or albuterol 0.083% albuterol via nebulizer one unit vial every 4 hours as needed for cough or wheeze.  You may use albuterol 2 puffs 5-15 minutes before activity to decrease cough or wheeze  Allergic rhinitis Not well-controlled Continue allergen avoidance measures directed toward pollens, ragweed, dust mite, dog, and cockroach as listed below Continue cetirizine 10 mg once a day as needed for  runny nose or itch Continue fluticasone nasal spray 1-2 sprays in each nostril once a day as needed for a stuffy nose Consider saline nasal rinses as needed for nasal symptoms. Use this before any medicated nasal sprays for best result Consider allergen immunotherapy if your symptoms are not well-controlled with the treatment plan as listed above  Allergic conjunctivitis Not well-controlled Begin cromolyn eye drops 1-2 drops in each eye up to 4 times a day as needed for red or itchy eyes Recommend use of a lubricating eyedrop such as blink or refresh.  Use this before using any medicated eyedrops  Hives (urticaria) Moderately well-controlled Use the least amount of medications while remaining hive free. Cetirizine (Zyrtec) 10mg  twice a day and famotidine (Pepcid) 20 mg twice a day. If no symptoms for 7-14 days then decrease to. Cetirizine (Zyrtec) 10mg  twice a day and famotidine (Pepcid) 20 mg once a day.  If no symptoms for 7-14 days then decrease to. Cetirizine (Zyrtec) 10mg  twice a day.  If no symptoms for 7-14 days then decrease to. Cetirizine (Zyrtec) 10mg  once a day.  May use Benadryl (diphenhydramine) as needed for breakthrough hives       If symptoms return, then step up dosage Keep a detailed symptom journal including foods eaten, contact with allergens, medications taken, weather changes.   Atopic dermatitis Moderately well-controlled Continue twice a day moisturizing routine For red itchy areas on your face and neck, begin desonide 0.05 ointment twice a day as needed. For red itchy areas below your face and neck, begin triamcinolone 0.1% ointment twice a day as needed. Keep your upcoming initial consultation appointment with your dermatologist  Reflux Not well-controlled Restart famotidine 20 mg twice a day for reflux.  Continue dietary and lifestyle modifications as listed below  Food allergy Not well-controlled Continue to avoid shellfish, peanuts, and tree nuts. In  case of an allergic reaction, take Benadryl 50 mg every 4  hours, and if life-threatening symptoms occur, inject with EpiPen 0.3 mg. Return to the clinic if you are interested in updating your food allergy testing. Remember to stop your antihistamines for 3 days before your testing appointment  Angioedema Stable If your symptoms re-occur, begin a journal of events that occurred for up to 6 hours before your symptoms began including foods and beverages consumed, soaps or perfumes you had contact with, and medications.  We will plan to update your food allergy skin testing. If lip swelling persists we will evaluate with blood work.   Epistaxis Stable Pinch both nostrils while leaning forward for at least 5 minutes before checking to see if the bleeding has stopped. If bleeding is not controlled within 5-10 minutes apply a cotton ball soaked with oxymetazoline (Afrin) to the bleeding nostril for a few seconds.  If the problem persists or worsens a referral to ENT for further evaluation may be necessary.  Call the clinic if this treatment plan is not working well for you  Follow up in 2 months or sooner if needed.   Return in about 2 months (around 07/04/2023), or if symptoms worsen or fail to improve.    Thank you for the opportunity to care for this patient.  Please do not hesitate to contact me with questions.  Thermon Leyland, FNP [facility]

## 2023-05-04 NOTE — Patient Instructions (Addendum)
Asthma Not well-controlled Restart Symbicort 160 to 2 puffs twice a day with a spacer to prevent cough or wheeze.  Restart montelukast 10 mg once a day to prevent cough or wheeze Continue albuterol 2 puffs every 4 hours as needed for cough or wheeze or albuterol 0.083% albuterol via nebulizer one unit vial every 4 hours as needed for cough or wheeze.  You may use albuterol 2 puffs 5-15 minutes before activity to decrease cough or wheeze  Allergic rhinitis Not well-controlled Continue allergen avoidance measures directed toward pollens, ragweed, dust mite, dog, and cockroach as listed below Continue cetirizine 10 mg once a day as needed for runny nose or itch Continue fluticasone nasal spray 1-2 sprays in each nostril once a day as needed for a stuffy nose Consider saline nasal rinses as needed for nasal symptoms. Use this before any medicated nasal sprays for best result Consider allergen immunotherapy if your symptoms are not well-controlled with the treatment plan as listed above  Allergic conjunctivitis Not well-controlled Begin cromolyn eye drops 1-2 drops in each eye up to 4 times a day as needed for red or itchy eyes Recommend use of a lubricating eyedrop such as blink or refresh.  Use this before using any medicated eyedrops  Hives (urticaria) Moderately well-controlled Use the least amount of medications while remaining hive free. Cetirizine (Zyrtec) 10mg  twice a day and famotidine (Pepcid) 20 mg twice a day. If no symptoms for 7-14 days then decrease to. Cetirizine (Zyrtec) 10mg  twice a day and famotidine (Pepcid) 20 mg once a day.  If no symptoms for 7-14 days then decrease to. Cetirizine (Zyrtec) 10mg  twice a day.  If no symptoms for 7-14 days then decrease to. Cetirizine (Zyrtec) 10mg  once a day.  May use Benadryl (diphenhydramine) as needed for breakthrough hives       If symptoms return, then step up dosage Keep a detailed symptom journal including foods eaten, contact  with allergens, medications taken, weather changes.   Atopic dermatitis Moderately well-controlled Continue twice a day moisturizing routine For red itchy areas on your face and neck, begin desonide 0.05 ointment twice a day as needed. For red itchy areas below your face and neck, begin triamcinolone 0.1% ointment twice a day as needed. Keep your upcoming initial consultation appointment with your dermatologist  Reflux Not well-controlled Restart famotidine 20 mg twice a day for reflux.  Continue dietary and lifestyle modifications as listed below  Food allergy Not well-controlled Continue to avoid shellfish, peanuts, and tree nuts. In case of an allergic reaction, take Benadryl 50 mg every 4 hours, and if life-threatening symptoms occur, inject with EpiPen 0.3 mg. Return to the clinic if you are interested in updating your food allergy testing. Remember to stop your antihistamines for 3 days before your testing appointment  Angioedema Stable If your symptoms re-occur, begin a journal of events that occurred for up to 6 hours before your symptoms began including foods and beverages consumed, soaps or perfumes you had contact with, and medications.  We will plan to update your food allergy skin testing. If lip swelling persists we will evaluate with blood work.   Epistaxis Stable Pinch both nostrils while leaning forward for at least 5 minutes before checking to see if the bleeding has stopped. If bleeding is not controlled within 5-10 minutes apply a cotton ball soaked with oxymetazoline (Afrin) to the bleeding nostril for a few seconds.  If the problem persists or worsens a referral to ENT for further evaluation may be  necessary.  Call the clinic if this treatment plan is not working well for you  Follow up in 2 months or sooner if needed.  Reducing Pollen Exposure The American Academy of Allergy, Asthma and Immunology suggests the following steps to reduce your exposure to pollen  during allergy seasons. Do not hang sheets or clothing out to dry; pollen may collect on these items. Do not mow lawns or spend time around freshly cut grass; mowing stirs up pollen. Keep windows closed at night.  Keep car windows closed while driving. Minimize morning activities outdoors, a time when pollen counts are usually at their highest. Stay indoors as much as possible when pollen counts or humidity is high and on windy days when pollen tends to remain in the air longer. Use air conditioning when possible.  Many air conditioners have filters that trap the pollen spores. Use a HEPA room air filter to remove pollen form the indoor air you breathe.   Control of Dust Mite Allergen Dust mites play a major role in allergic asthma and rhinitis. They occur in environments with high humidity wherever human skin is found. Dust mites absorb humidity from the atmosphere (ie, they do not drink) and feed on organic matter (including shed human and animal skin). Dust mites are a microscopic type of insect that you cannot see with the naked eye. High levels of dust mites have been detected from mattresses, pillows, carpets, upholstered furniture, bed covers, clothes, soft toys and any woven material. The principal allergen of the dust mite is found in its feces. A gram of dust may contain 1,000 mites and 250,000 fecal particles. Mite antigen is easily measured in the air during house cleaning activities. Dust mites do not bite and do not cause harm to humans, other than by triggering allergies/asthma.  Ways to decrease your exposure to dust mites in your home:  1. Encase mattresses, box springs and pillows with a mite-impermeable barrier or cover  2. Wash sheets, blankets and drapes weekly in hot water (130 F) with detergent and dry them in a dryer on the hot setting.  3. Have the room cleaned frequently with a vacuum cleaner and a damp dust-mop. For carpeting or rugs, vacuuming with a vacuum cleaner  equipped with a high-efficiency particulate air (HEPA) filter. The dust mite allergic individual should not be in a room which is being cleaned and should wait 1 hour after cleaning before going into the room.  4. Do not sleep on upholstered furniture (eg, couches).  5. If possible removing carpeting, upholstered furniture and drapery from the home is ideal. Horizontal blinds should be eliminated in the rooms where the person spends the most time (bedroom, study, television room). Washable vinyl, roller-type shades are optimal.  6. Remove all non-washable stuffed toys from the bedroom. Wash stuffed toys weekly like sheets and blankets above.  7. Reduce indoor humidity to less than 50%. Inexpensive humidity monitors can be purchased at most hardware stores. Do not use a humidifier as can make the problem worse and are not recommended.  Control of Cockroach Allergen Cockroach allergen has been identified as an important cause of acute attacks of asthma, especially in urban settings.  There are fifty-five species of cockroach that exist in the [place], however only three, the [place], [place] species produce allergen that can affect patients with Asthma.  Allergens can be obtained from fecal particles, egg casings and secretions from cockroaches.    Remove food sources. Reduce access to water.  Seal access and entry points. Spray runways with 0.5-1% Diazinon or Chlorpyrifos Blow boric acid power under stoves and refrigerator. Place bait stations (hydramethylnon) at feeding sites.    Lifestyle Changes for Controlling GERD When you have GERD, stomach acid feels as if it's backing up toward your mouth. Whether or not you take medication to control your GERD, your symptoms can often be improved with lifestyle changes.   Raise Your Head Reflux is more likely to strike when you're lying down flat, because stomach fluid can flow backward more easily. Raising the head of your bed  4-6 inches can help. To do this: Slide blocks or books under the legs at the head of your bed. Or, place a wedge under the mattress. Many foam stores can make a suitable wedge for you. The wedge should run from your waist to the top of your head. Don't just prop your head on several pillows. This increases pressure on your stomach. It can make GERD worse.  Watch Your Eating Habits Certain foods may increase the acid in your stomach or relax the lower esophageal sphincter, making GERD more likely. It's best to avoid the following: Coffee, tea, and carbonated drinks (with and without caffeine) Fatty, fried, or spicy food Mint, chocolate, onions, and tomatoes Any other foods that seem to irritate your stomach or cause you pain  Relieve the Pressure Eat smaller meals, even if you have to eat more often. Don't lie down right after you eat. Wait a few hours for your stomach to empty. Avoid tight belts and tight-fitting clothes. Lose excess weight.  Tobacco and Alcohol Avoid smoking tobacco and drinking alcohol. They can make GERD symptoms worse. Begin Symbicort

## 2023-05-05 ENCOUNTER — Other Ambulatory Visit: Payer: Self-pay

## 2023-05-05 ENCOUNTER — Telehealth: Payer: Self-pay

## 2023-05-05 MED ORDER — FLUTICASONE PROPIONATE 50 MCG/ACT NA SUSP: 1.0000 | Freq: Every day | NASAL | 5 refills | Status: DC

## 2023-05-05 NOTE — Telephone Encounter (Signed)
Pt informed of this and stated understanding  ?

## 2023-05-05 NOTE — Patient Instructions (Incomplete)
Asthma Not well-controlled Restart Symbicort 160 to 2 puffs twice a day with a spacer to prevent cough or wheeze.  Restart montelukast 10 mg once a day to prevent cough or wheeze Continue albuterol 2 puffs every 4 hours as needed for cough or wheeze or albuterol 0.083% albuterol via nebulizer one unit vial every 4 hours as needed for cough or wheeze.  You may use albuterol 2 puffs 5-15 minutes before activity to decrease cough or wheeze  Allergic rhinitis Not well-controlled Continue allergen avoidance measures directed toward pollens, ragweed, dust mite, dog, and cockroach as listed below Continue cetirizine 10 mg once a day as needed for runny nose or itch Continue fluticasone nasal spray 1-2 sprays in each nostril once a day as needed for a stuffy nose Consider saline nasal rinses as needed for nasal symptoms. Use this before any medicated nasal sprays for best result Consider allergen immunotherapy if your symptoms are not well-controlled with the treatment plan as listed above  Allergic conjunctivitis Not well-controlled Begin cromolyn eye drops 1-2 drops in each eye up to 4 times a day as needed for red or itchy eyes Recommend use of a lubricating eyedrop such as blink or refresh.  Use this before using any medicated eyedrops  Hives (urticaria) Moderately well-controlled Use the least amount of medications while remaining hive free. Cetirizine (Zyrtec) 10mg  twice a day and famotidine (Pepcid) 20 mg twice a day. If no symptoms for 7-14 days then decrease to. Cetirizine (Zyrtec) 10mg  twice a day and famotidine (Pepcid) 20 mg once a day.  If no symptoms for 7-14 days then decrease to. Cetirizine (Zyrtec) 10mg  twice a day.  If no symptoms for 7-14 days then decrease to. Cetirizine (Zyrtec) 10mg  once a day.  May use Benadryl (diphenhydramine) as needed for breakthrough hives       If symptoms return, then step up dosage Keep a detailed symptom journal including foods eaten, contact  with allergens, medications taken, weather changes.   Atopic dermatitis Moderately well-controlled Continue twice a day moisturizing routine For red itchy areas on your face and neck, begin desonide 0.05 ointment twice a day as needed. For red itchy areas below your face and neck, begin triamcinolone 0.1% ointment twice a day as needed. Keep your upcoming initial consultation appointment with your dermatologist  Reflux Not well-controlled Restart famotidine 20 mg twice a day for reflux.  Continue dietary and lifestyle modifications as listed below  Food allergy Not well-controlled Continue to avoid shellfish, peanuts, and tree nuts. In case of an allergic reaction, take Benadryl 50 mg every 4 hours, and if life-threatening symptoms occur, inject with EpiPen 0.3 mg. Return to the clinic if you are interested in updating your food allergy testing. Remember to stop your antihistamines for 3 days before your testing appointment  Angioedema Stable If your symptoms re-occur, begin a journal of events that occurred for up to 6 hours before your symptoms began including foods and beverages consumed, soaps or perfumes you had contact with, and medications.  We will plan to update your food allergy skin testing. If lip swelling persists we will evaluate with blood work.   Epistaxis Stable Pinch both nostrils while leaning forward for at least 5 minutes before checking to see if the bleeding has stopped. If bleeding is not controlled within 5-10 minutes apply a cotton ball soaked with oxymetazoline (Afrin) to the bleeding nostril for a few seconds.  If the problem persists or worsens a referral to ENT for further evaluation may be  necessary.  Call the clinic if this treatment plan is not working well for you  Follow up in 2 months or sooner if needed.  Reducing Pollen Exposure The American Academy of Allergy, Asthma and Immunology suggests the following steps to reduce your exposure to pollen  during allergy seasons. Do not hang sheets or clothing out to dry; pollen may collect on these items. Do not mow lawns or spend time around freshly cut grass; mowing stirs up pollen. Keep windows closed at night.  Keep car windows closed while driving. Minimize morning activities outdoors, a time when pollen counts are usually at their highest. Stay indoors as much as possible when pollen counts or humidity is high and on windy days when pollen tends to remain in the air longer. Use air conditioning when possible.  Many air conditioners have filters that trap the pollen spores. Use a HEPA room air filter to remove pollen form the indoor air you breathe.   Control of Dust Mite Allergen Dust mites play a major role in allergic asthma and rhinitis. They occur in environments with high humidity wherever human skin is found. Dust mites absorb humidity from the atmosphere (ie, they do not drink) and feed on organic matter (including shed human and animal skin). Dust mites are a microscopic type of insect that you cannot see with the naked eye. High levels of dust mites have been detected from mattresses, pillows, carpets, upholstered furniture, bed covers, clothes, soft toys and any woven material. The principal allergen of the dust mite is found in its feces. A gram of dust may contain 1,000 mites and 250,000 fecal particles. Mite antigen is easily measured in the air during house cleaning activities. Dust mites do not bite and do not cause harm to humans, other than by triggering allergies/asthma.  Ways to decrease your exposure to dust mites in your home:  1. Encase mattresses, box springs and pillows with a mite-impermeable barrier or cover  2. Wash sheets, blankets and drapes weekly in hot water (130 F) with detergent and dry them in a dryer on the hot setting.  3. Have the room cleaned frequently with a vacuum cleaner and a damp dust-mop. For carpeting or rugs, vacuuming with a vacuum cleaner  equipped with a high-efficiency particulate air (HEPA) filter. The dust mite allergic individual should not be in a room which is being cleaned and should wait 1 hour after cleaning before going into the room.  4. Do not sleep on upholstered furniture (eg, couches).  5. If possible removing carpeting, upholstered furniture and drapery from the home is ideal. Horizontal blinds should be eliminated in the rooms where the person spends the most time (bedroom, study, television room). Washable vinyl, roller-type shades are optimal.  6. Remove all non-washable stuffed toys from the bedroom. Wash stuffed toys weekly like sheets and blankets above.  7. Reduce indoor humidity to less than 50%. Inexpensive humidity monitors can be purchased at most hardware stores. Do not use a humidifier as can make the problem worse and are not recommended.  Control of Cockroach Allergen Cockroach allergen has been identified as an important cause of acute attacks of asthma, especially in urban settings.  There are fifty-five species of cockroach that exist in the [place], however only three, the [place], [place] species produce allergen that can affect patients with Asthma.  Allergens can be obtained from fecal particles, egg casings and secretions from cockroaches.    Remove food sources. Reduce access to water.  Seal access and entry points. Spray runways with 0.5-1% Diazinon or Chlorpyrifos Blow boric acid power under stoves and refrigerator. Place bait stations (hydramethylnon) at feeding sites.    Lifestyle Changes for Controlling GERD When you have GERD, stomach acid feels as if it's backing up toward your mouth. Whether or not you take medication to control your GERD, your symptoms can often be improved with lifestyle changes.   Raise Your Head Reflux is more likely to strike when you're lying down flat, because stomach fluid can flow backward more easily. Raising the head of your bed  4-6 inches can help. To do this: Slide blocks or books under the legs at the head of your bed. Or, place a wedge under the mattress. Many foam stores can make a suitable wedge for you. The wedge should run from your waist to the top of your head. Don't just prop your head on several pillows. This increases pressure on your stomach. It can make GERD worse.  Watch Your Eating Habits Certain foods may increase the acid in your stomach or relax the lower esophageal sphincter, making GERD more likely. It's best to avoid the following: Coffee, tea, and carbonated drinks (with and without caffeine) Fatty, fried, or spicy food Mint, chocolate, onions, and tomatoes Any other foods that seem to irritate your stomach or cause you pain  Relieve the Pressure Eat smaller meals, even if you have to eat more often. Don't lie down right after you eat. Wait a few hours for your stomach to empty. Avoid tight belts and tight-fitting clothes. Lose excess weight.  Tobacco and Alcohol Avoid smoking tobacco and drinking alcohol. They can make GERD symptoms worse. Begin Symbicort

## 2023-05-05 NOTE — Progress Notes (Unsigned)
[address] [place] [address] Dept: [phone]  FOLLOW UP NOTE  Patient ID: Andjela Hjorth, female    DOB: [birth year]  Age: 22 y.o. MRN: [id number] Date of Office Visit: 05/07/2023  Assessment  Chief Complaint: No chief complaint on file.  HPI Sylvanna Mizerak is a 22 year old female who presents to the clinic for a follow up visit. She was last seen in this clinic on 05/04/2023 for evaluation of asthma, allergic rhinitis, urticaria, atopic dermatitis, angioedema, epistaxis, reflux, and food allergy to shellfish, peanuts, and tree nuts.   Her last environmental allergy skin testing was on 04/19/2021 and was positive to pollens, dust mite, dog, and cockroach.  Her last food allergy testing was on 04/19/2021 and was positive to shrimp. Drug Allergies:  Allergies  Allergen Reactions   Shellfish Allergy     Physical Exam: There were no vitals taken for this visit.   Physical Exam  Diagnostics:    Assessment and Plan: No diagnosis found.  No orders of the defined types were placed in this encounter.   There are no Patient Instructions on file for this visit.  No follow-ups on file.    Thank you for the opportunity to care for this patient.  Please do not hesitate to contact me with questions.  Thermon Leyland, FNP [facility]

## 2023-05-05 NOTE — Telephone Encounter (Signed)
Montelukast is fine. She can use Flonase and saline rinses and we will see her on Thursday. Please let her know that she will be able to take antihistamines right after the testing is done. Thank you

## 2023-05-05 NOTE — Telephone Encounter (Signed)
Spoke to patient about Anne's instructions. Patient stated that she has not had any antihistamines or famotidine. But, she did take montelukast last night. Patient stated that provider said that it was ok to take it since it did not have antihistamines in it. She also stated that she needed to take something do to her allergy symptoms.

## 2023-05-05 NOTE — Telephone Encounter (Signed)
===  View-only below this line=== ----- Message ----- From: Hetty Blend, FNP Sent: 05/05/2023  10:11 AM EDT To: [facility]  Can someone please remind her to stop cetirizine and famotidine  (or any other antihistamines she is taking) for testing. If she needs to change her testing day please help her reschedule. Thank you  Lm about stopping and to call us back if she needs to change the appt due to not stopping

## 2023-05-07 ENCOUNTER — Encounter: Payer: Self-pay | Admitting: Family Medicine

## 2023-05-07 ENCOUNTER — Ambulatory Visit: Payer: Medicaid Other | Admitting: Family Medicine

## 2023-05-07 ENCOUNTER — Other Ambulatory Visit: Payer: Self-pay

## 2023-05-07 ENCOUNTER — Ambulatory Visit (INDEPENDENT_AMBULATORY_CARE_PROVIDER_SITE_OTHER): Payer: Medicaid Other | Admitting: Family Medicine

## 2023-05-07 VITALS — BP 120/70 | HR 115 | Temp 98.2°F | Resp 16 | Wt 206.6 lb

## 2023-05-07 DIAGNOSIS — K219 Gastro-esophageal reflux disease without esophagitis: Secondary | ICD-10-CM

## 2023-05-07 DIAGNOSIS — J302 Other seasonal allergic rhinitis: Secondary | ICD-10-CM | POA: Diagnosis not present

## 2023-05-07 DIAGNOSIS — L2084 Intrinsic (allergic) eczema: Secondary | ICD-10-CM

## 2023-05-07 DIAGNOSIS — T7800XA Anaphylactic reaction due to unspecified food, initial encounter: Secondary | ICD-10-CM

## 2023-05-07 DIAGNOSIS — H1010 Acute atopic conjunctivitis, unspecified eye: Secondary | ICD-10-CM

## 2023-05-07 DIAGNOSIS — J3089 Other allergic rhinitis: Secondary | ICD-10-CM

## 2023-05-07 DIAGNOSIS — H1013 Acute atopic conjunctivitis, bilateral: Secondary | ICD-10-CM | POA: Diagnosis not present

## 2023-05-07 DIAGNOSIS — L501 Idiopathic urticaria: Secondary | ICD-10-CM

## 2023-05-07 DIAGNOSIS — J4540 Moderate persistent asthma, uncomplicated: Secondary | ICD-10-CM

## 2023-05-12 ENCOUNTER — Ambulatory Visit: Payer: Medicaid Other | Admitting: Dermatology

## 2023-07-07 ENCOUNTER — Other Ambulatory Visit: Payer: Self-pay

## 2023-07-07 ENCOUNTER — Encounter (HOSPITAL_BASED_OUTPATIENT_CLINIC_OR_DEPARTMENT_OTHER): Payer: Self-pay

## 2023-07-07 ENCOUNTER — Emergency Department (HOSPITAL_BASED_OUTPATIENT_CLINIC_OR_DEPARTMENT_OTHER): Payer: Medicaid Other

## 2023-07-07 ENCOUNTER — Emergency Department (HOSPITAL_BASED_OUTPATIENT_CLINIC_OR_DEPARTMENT_OTHER)
Admission: EM | Admit: 2023-07-07 | Discharge: 2023-07-07 | Disposition: A | Discharge status: Home / Self Care | Payer: Medicaid Other | Attending: Emergency Medicine | Admitting: Emergency Medicine

## 2023-07-07 DIAGNOSIS — Z1152 Encounter for screening for COVID-19: Secondary | ICD-10-CM | POA: Diagnosis not present

## 2023-07-07 DIAGNOSIS — D509 Iron deficiency anemia, unspecified: Secondary | ICD-10-CM | POA: Insufficient documentation

## 2023-07-07 DIAGNOSIS — J189 Pneumonia, unspecified organism: Secondary | ICD-10-CM | POA: Diagnosis not present

## 2023-07-07 DIAGNOSIS — R042 Hemoptysis: Secondary | ICD-10-CM | POA: Diagnosis present

## 2023-07-07 DIAGNOSIS — R030 Elevated blood-pressure reading, without diagnosis of hypertension: Secondary | ICD-10-CM | POA: Diagnosis not present

## 2023-07-07 LAB — CBC WITH DIFFERENTIAL/PLATELET
Abs Immature Granulocytes: 0.02 10*3/uL (ref 0.00–0.07)
Basophils Absolute: 0 10*3/uL (ref 0.0–0.1)
Basophils Relative: 0 %
Eosinophils Absolute: 0.3 10*3/uL (ref 0.0–0.5)
Eosinophils Relative: 4 %
HCT: 33.2 % — ABNORMAL LOW (ref 36.0–46.0)
Hemoglobin: 9.9 g/dL — ABNORMAL LOW (ref 12.0–15.0)
Immature Granulocytes: 0 %
Lymphocytes Relative: 30 %
Lymphs Abs: 2.2 10*3/uL (ref 0.7–4.0)
MCH: 21.8 pg — ABNORMAL LOW (ref 26.0–34.0)
MCHC: 29.8 g/dL — ABNORMAL LOW (ref 30.0–36.0)
MCV: 73.1 fL — ABNORMAL LOW (ref 80.0–100.0)
Monocytes Absolute: 0.8 10*3/uL (ref 0.1–1.0)
Monocytes Relative: 10 %
Neutro Abs: 4.1 10*3/uL (ref 1.7–7.7)
Neutrophils Relative %: 56 %
Platelets: 355 10*3/uL (ref 150–400)
RBC: 4.54 MIL/uL (ref 3.87–5.11)
RDW: 15.9 % — ABNORMAL HIGH (ref 11.5–15.5)
WBC: 7.4 10*3/uL (ref 4.0–10.5)
nRBC: 0 % (ref 0.0–0.2)

## 2023-07-07 LAB — HEPATIC FUNCTION PANEL
ALT: 5 U/L (ref 0–44)
AST: 13 U/L — ABNORMAL LOW (ref 15–41)
Albumin: 4.5 g/dL (ref 3.5–5.0)
Alkaline Phosphatase: 54 U/L (ref 38–126)
Bilirubin, Direct: <0.1 mg/dL (ref 0.0–0.2)
Total Bilirubin: 0.3 mg/dL (ref <1.2)
Total Protein: 7.5 g/dL (ref 6.5–8.1)

## 2023-07-07 LAB — URINALYSIS, ROUTINE W REFLEX MICROSCOPIC
Bilirubin Urine: NEGATIVE
Glucose, UA: NEGATIVE mg/dL
Hgb urine dipstick: NEGATIVE
Ketones, ur: NEGATIVE mg/dL
Leukocytes,Ua: NEGATIVE
Nitrite: NEGATIVE
Protein, ur: NEGATIVE mg/dL
Specific Gravity, Urine: 1.015 (ref 1.005–1.030)
pH: 7 (ref 5.0–8.0)

## 2023-07-07 LAB — D-DIMER, QUANTITATIVE: D-Dimer, Quant: 0.31 ug{FEU}/mL (ref 0.00–0.50)

## 2023-07-07 LAB — GROUP A STREP BY PCR: Group A Strep by PCR: NOT DETECTED

## 2023-07-07 LAB — BASIC METABOLIC PANEL WITH GFR
Anion gap: 8 (ref 5–15)
BUN: 9 mg/dL (ref 6–20)
CO2: 26 mmol/L (ref 22–32)
Calcium: 9.6 mg/dL (ref 8.9–10.3)
Chloride: 103 mmol/L (ref 98–111)
Creatinine, Ser: 0.63 mg/dL (ref 0.44–1.00)
GFR, Estimated: >60 mL/min
Glucose, Bld: 83 mg/dL (ref 70–99)
Potassium: 3.6 mmol/L (ref 3.5–5.1)
Sodium: 137 mmol/L (ref 135–145)

## 2023-07-07 LAB — LIPASE, BLOOD: Lipase: 33 U/L (ref 11–51)

## 2023-07-07 LAB — PREGNANCY, URINE: Preg Test, Ur: NEGATIVE

## 2023-07-07 LAB — SARS CORONAVIRUS 2 BY RT PCR: SARS Coronavirus 2 by RT PCR: NEGATIVE

## 2023-07-07 MED ORDER — DOXYCYCLINE HYCLATE 100 MG PO TABS
ORAL_TABLET | ORAL | Status: AC
  Filled 2023-07-07: qty 1

## 2023-07-07 MED ORDER — ONDANSETRON 4 MG PO TBDP
ORAL_TABLET | ORAL | Status: AC
  Filled 2023-07-07: qty 1

## 2023-07-07 MED ORDER — DOXYCYCLINE HYCLATE 100 MG PO CAPS: 100.0000 mg | ORAL_CAPSULE | Freq: Two times a day (BID) | ORAL | 0 refills | Status: DC

## 2023-07-07 MED ORDER — ONDANSETRON HCL 4 MG PO TABS: 4.0000 mg | ORAL_TABLET | Freq: Four times a day (QID) | ORAL | 0 refills | Status: DC | PRN

## 2023-07-07 MED ORDER — DOXYCYCLINE HYCLATE 100 MG PO TABS
100.0000 mg | ORAL_TABLET | Freq: Once | ORAL | Status: AC
  Administered 2023-07-07: 100 mg via ORAL

## 2023-07-07 MED ORDER — AMOXICILLIN-POT CLAVULANATE 875-125 MG PO TABS
1.0000 | ORAL_TABLET | Freq: Once | ORAL | Status: AC
  Administered 2023-07-07: 1 via ORAL

## 2023-07-07 MED ORDER — AMOXICILLIN-POT CLAVULANATE 875-125 MG PO TABS
ORAL_TABLET | ORAL | Status: AC
  Filled 2023-07-07: qty 1

## 2023-07-07 MED ORDER — AMOXICILLIN-POT CLAVULANATE 875-125 MG PO TABS: 1.0000 | ORAL_TABLET | Freq: Two times a day (BID) | ORAL | 0 refills | Status: DC

## 2023-07-07 MED ORDER — ONDANSETRON 4 MG PO TBDP
4.0000 mg | ORAL_TABLET | Freq: Once | ORAL | Status: AC
  Administered 2023-07-07: 4 mg via ORAL

## 2023-07-07 NOTE — Discharge Instructions (Addendum)
As get your blood pressure read evaluated when you are not sick and in the emergency department. You are being treated for community-acquired pneumonia given your flulike symptoms, sore throat and bloody cough today. Your vital signs are stable at this time and you do not have any low oxygen saturations. He did take 2 antibiotics to cover for both types of bacterial pneumonia. Please read all the attached information regarding pneumonia.  Continue to be anemic.  Your anemia is microcytic.  This is normal for menstruating females to have an iron deficiency anemia due to bleeding.  You may take over-the-counter iron such as Slow Fe.  All of them can cause constipation.  They can upset your stomach.  You should take them before bed with food.  it turn your stool black.  Contact a health care provider if: You have a fever. You have trouble sleeping because you cannot control your cough with cough medicine. Get help right away if: Your shortness of breath becomes worse. Your chest pain increases. Your sickness becomes worse, especially if you are an older adult or have a weak immune system. You cough up blood. These symptoms may be an emergency. Get help right away. Call 911. Do not wait to see if the symptoms will go away. Do not drive yourself to the hospital.

## 2023-07-07 NOTE — ED Provider Notes (Signed)
[place] EMERGENCY DEPARTMENT AT [facility] Provider Note   CSN: [id number] Arrival date & time: 07/07/23  1858     History  Chief Complaint  Patient presents with   Sore Throat    Jean Frank is a 22 y.o. female who presents emergency department with a chief complaint of hemoptysis.  Patient states she has a past medical history of seasonal environmental allergies and anemia.  Patient states that yesterday morning she woke up with a sore throat and nasal congestion.  She took Claritin and a daytime TheraFlu.  In the evening she took a nap and when she woke up she felt much worse and had a headache, worsening congestion body aches and flulike symptoms.  Patient states that she was able to get to bed.  Today she did not feel good and had burning in her throat and nasal congestion.  She reports that this evening she went to the bathroom and she blew her nose.  While she was using the bathroom she started to feel like she had to cough.  She coughed several times and states that at first she coughed up what looked like spit mixed with blood and then ended up coughing up blood clot.  She is not on any current oral contraceptive pills.  She states she has been off of them for about 4 months.  She denies that she could be pregnant.  She has a history of iron deficiency anemia.  She states she has a chronic left lower extremity that is slightly larger than the right but denies any new swelling or pain.  She denies any confinement or recent surgeries and no history of blood clots.  She does not have chest pain or shortness of breath and has been afebrile.   Sore Throat       Home Medications Prior to Admission medications   Medication Sig Start Date End Date Taking? Authorizing Provider  amoxicillin-clavulanate (AUGMENTIN) 875-125 MG tablet Take 1 tablet by mouth every 12 (twelve) hours. 07/07/23  Yes Janelle Culton, PA-C  doxycycline (VIBRAMYCIN) 100 MG capsule Take 1 capsule  (100 mg total) by mouth 2 (two) times daily. 07/07/23  Yes Yamileth Hayse, PA-C  ondansetron (ZOFRAN) 4 MG tablet Take 1 tablet (4 mg total) by mouth every 6 (six) hours as needed for nausea or vomiting. 07/07/23  Yes Dashanique Brownstein, PA-C  albuterol (PROVENTIL HFA) 108 (90 Base) MCG/ACT inhaler Inhale 2 puffs into the lungs every 4 (four) hours as needed for wheezing or shortness of breath. 05/04/23   Hetty Blend, FNP  azelastine (ASTELIN) 0.1 % nasal spray 2 sprays each nostril 1-2 times daily prn. . 07/08/22   Hetty Blend, FNP  cetirizine (ZYRTEC) 10 MG tablet Take 1 tablet (10 mg total) by mouth daily. 05/04/23   Hetty Blend, FNP  cromolyn (OPTICROM) 4 % ophthalmic solution Place 2 drops into both eyes 4 (four) times daily. 05/04/23   Hetty Blend, FNP  desonide (DESOWEN) 0.05 % cream Apply topically 2 (two) times daily as needed. 07/08/22   Hetty Blend, FNP  desonide (DESOWEN) 0.05 % ointment Apply 1 Application topically 2 (two) times daily. 05/04/23   Ambs, Norvel Richards, FNP  EPIPEN 2-PAK 0.3 MG/0.3ML SOAJ injection Inject 0.3 mg into the muscle as needed for anaphylaxis. 05/04/23   Hetty Blend, FNP  famotidine (PEPCID) 20 MG tablet Take 1 tablet (20 mg total) by mouth 2 (two) times daily as needed for heartburn or indigestion. 05/04/23  Hetty Blend, FNP  fluticasone (FLONASE) 50 MCG/ACT nasal spray Place 1-2 sprays into both nostrils daily. 05/05/23   Hetty Blend, FNP  Iron, Ferrous Sulfate, 325 (65 Fe) MG TABS Take 325 mg by mouth daily. 03/05/23   McElwee, Lauren A, NP  montelukast (SINGULAIR) 10 MG tablet Take 1 tablet (10 mg total) by mouth at bedtime. 05/04/23   Hetty Blend, FNP  NEXIUM 20 MG packet Take 20 mg by mouth daily before breakfast. 09/03/22   McElwee, Jake Church, NP  norethindrone-ethinyl estradiol-FE (LOESTRIN FE 1/20) 1-20 MG-MCG tablet Take 1 tablet by mouth daily. 01/15/23   McElwee, Jake Church, NP  SUMAtriptan (IMITREX) 25 MG tablet Take 1 tablet (25 mg total) by mouth every 2 (two)  hours as needed for migraine. May repeat in 2 hours if headache persists or recurs. 07/17/22   McElwee, Jake Church, NP  SYMBICORT 160-4.5 MCG/ACT inhaler Inhale 2 puffs into the lungs 2 (two) times daily. 05/04/23   Hetty Blend, FNP  triamcinolone ointment (KENALOG) 0.1 % Apply 1 Application topically 2 (two) times daily as needed. 05/04/23   Ambs, Norvel Richards, FNP  VENTOLIN HFA 108 (90 Base) MCG/ACT inhaler Inhale 2 puffs into the lungs every 4 (four) hours as needed for wheezing or shortness of breath. 07/08/22   Hetty Blend, FNP      Allergies    Shellfish allergy    Review of Systems   Review of Systems  Physical Exam Updated Vital Signs BP (!) 145/88   Pulse (!) 102   Temp 97.9 F (36.6 C) (Tympanic)   Resp 18   Ht 5\' 6"  (1.676 m)   Wt 93 kg   LMP 06/29/2023   SpO2 100%   BMI 33.09 kg/m  Physical Exam Vitals and nursing note reviewed.  Constitutional:      General: She is not in acute distress.    Appearance: She is well-developed. She is not diaphoretic.  HENT:     Head: Normocephalic and atraumatic.     Right Ear: External ear normal.     Left Ear: External ear normal.     Nose: Nose normal.     Mouth/Throat:     Mouth: Mucous membranes are moist.  Eyes:     General: No scleral icterus.    Conjunctiva/sclera: Conjunctivae normal.  Cardiovascular:     Rate and Rhythm: Regular rhythm. Tachycardia present.     Heart sounds: Normal heart sounds. No murmur heard.    No friction rub. No gallop.  Pulmonary:     Effort: Pulmonary effort is normal. No respiratory distress.     Breath sounds: Normal breath sounds.  Abdominal:     General: Bowel sounds are normal. There is no distension.     Palpations: Abdomen is soft. There is no mass.     Tenderness: There is no abdominal tenderness. There is no guarding.  Musculoskeletal:     Cervical back: Normal range of motion.  Skin:    General: Skin is warm and dry.  Neurological:     Mental Status: She is alert and oriented to  person, place, and time.  Psychiatric:        Behavior: Behavior normal.     ED Results / Procedures / Treatments   Labs (all labs ordered are listed, but only abnormal results are displayed) Labs Reviewed  CBC WITH DIFFERENTIAL/PLATELET - Abnormal; Notable for the following components:      Result Value   Hemoglobin 9.9 (*)  HCT 33.2 (*)    MCV 73.1 (*)    MCH 21.8 (*)    MCHC 29.8 (*)    RDW 15.9 (*)    All other components within normal limits  HEPATIC FUNCTION PANEL - Abnormal; Notable for the following components:   AST 13 (*)    All other components within normal limits  SARS CORONAVIRUS 2 BY RT PCR  GROUP A STREP BY PCR  BASIC METABOLIC PANEL  URINALYSIS, ROUTINE W REFLEX MICROSCOPIC  PREGNANCY, URINE  D-DIMER, QUANTITATIVE  LIPASE, BLOOD    EKG EKG Interpretation Date/Time:  Tuesday July 07 2023 20:04:30 EST Ventricular Rate:  115 PR Interval:  164 QRS Duration:  83 QT Interval:  308 QTC Calculation: 426 R Axis:   57  Text Interpretation: Sinus tachycardia Baseline wander in lead(s) V3 Since last tracing rate faster Confirmed by Jacalyn Lefevre [phone]) on 07/07/2023 8:07:44 PM  Radiology No results found.  Procedures Procedures    Medications Ordered in ED Medications  amoxicillin-clavulanate (AUGMENTIN) 875-125 MG per tablet 1 tablet (has no administration in time range)  doxycycline (VIBRA-TABS) tablet 100 mg (has no administration in time range)  ondansetron (ZOFRAN-ODT) disintegrating tablet 4 mg (has no administration in time range)    ED Course/ Medical Decision Making/ A&P Clinical Course as of 07/07/23 2209  Tue Jul 07, 2023  2049 Hemoglobin(!): 9.9 [AH]  2049 D-dimer, quantitative Patient's D-dimer negative. [AH]    Clinical Course User Index [AH] Arthor Captain, PA-C                                 Medical Decision Making  22 year old female who presents emergency department with chief complaint of hemoptysis.The  differential diagnosis for hematuria includes but is not limited to cystitis, urinary calculi, BPH, renal cell carcinoma, transitional cell carcinoma, glomerulonephritis, polycystic kidney disease, anticoagulant usage, prostate cancer, papillary necrosis (EGD in sig abuse, DM, sickle cell trait or disease), renal infarction, interstitial nephritis, medullary sponge kidney, radiation or chemical cystitis (e.g. Cyclophosphamide), atrophic vaginitis, schistosomiasis, menses, urethra his, urethral diverticula. Unfortunately given patient's tachycardia and hemoptysis she is not PERC negative.  I have ordered labs, D-dimer and chest x-ray.  Patient will be placed on cardiac monitoring.  She is well-appearing.   After review of all data points suspect the patient most likely has unity acquired pneumonia. Fortunately due to extended read times for radiology her chest x-ray has not been read however I have visualized and interpreted chest x-ray and reviewed films with my attending physician.  Suspect this is likely a community-acquired pneumonia given flulike symptoms and hemoptysis today.  Patient also has no hypoxia throughout her visit.  She is mildly tachycardic and hypertensive.  I reviewed the patient's labs which shows her Citic anemia and a menstrual age female.  BMP without abnormality.  Pregnancy test and urine negative.  Normal hepatic function and lipase.  COVID test negative.  Negative for group A strep.  D-dimer negative.  Amount and/or Complexity of Data Reviewed Labs: ordered. Decision-making details documented in ED Course. Radiology: ordered and independent interpretation performed.    Details: Prior medical decision making action. ECG/medicine tests: ordered and independent interpretation performed.    Details: Sinus tachycardia without evidence of acute ischemia  Risk Prescription drug management.           Final Clinical Impression(s) / ED Diagnoses Final diagnoses:   Community acquired pneumonia, unspecified laterality  Elevated blood pressure  reading  Microcytic anemia    Rx / DC Orders ED Discharge Orders          Ordered    amoxicillin-clavulanate (AUGMENTIN) 875-125 MG tablet  Every 12 hours        07/07/23 2203    doxycycline (VIBRAMYCIN) 100 MG capsule  2 times daily        07/07/23 2203    ondansetron (ZOFRAN) 4 MG tablet  Every 6 hours PRN        07/07/23 2203              Arthor Captain, PA-C 07/07/23 2210    Jacalyn Lefevre, MD 07/08/23 1605

## 2023-07-07 NOTE — ED Notes (Signed)
Reviewed AVS with patient, patient expressed understanding of directions, denies further questions at this time. 

## 2023-07-07 NOTE — ED Triage Notes (Signed)
Complaining of sore throat and coughing up blood that started yesterday. Hurts to swallow. Has a headache and abdominal pain as well

## 2023-07-14 ENCOUNTER — Telehealth: Payer: Medicaid Other | Admitting: Family Medicine

## 2023-07-14 ENCOUNTER — Telehealth: Payer: Medicaid Other | Admitting: Physician Assistant

## 2023-07-14 DIAGNOSIS — J189 Pneumonia, unspecified organism: Secondary | ICD-10-CM

## 2023-07-14 MED ORDER — AMOXICILLIN-POT CLAVULANATE 400-57 MG/5ML PO SUSR: ORAL | 0 refills | Status: DC

## 2023-07-14 NOTE — Progress Notes (Signed)
[place] made VV

## 2023-07-14 NOTE — Patient Instructions (Signed)
Cloyd Stagers, thank you for joining [name], PA-C for today's virtual visit.  While this provider is not your primary care provider (PCP), if your PCP is located in our provider database this encounter information will be shared with them immediately following your visit.   A [facility] MyChart account gives you access to today's visit and all your visits, tests, and labs performed at [facility] " click here if you don't have a [facility] MyChart account or go to [email or web address]  Consent: (Patient) Jean Frank provided verbal consent for this virtual visit at the beginning of the encounter.  Current Medications:  Current Outpatient Medications:    amoxicillin-clavulanate (AUGMENTIN) 400-57 MG/5ML suspension, Take 10 mL by mouth twice daily for 7 day., Disp: 140 mL, Rfl: 0   albuterol (PROVENTIL HFA) 108 (90 Base) MCG/ACT inhaler, Inhale 2 puffs into the lungs every 4 (four) hours as needed for wheezing or shortness of breath., Disp: 18 g, Rfl: 1   azelastine (ASTELIN) 0.1 % nasal spray, 2 sprays each nostril 1-2 times daily prn. ., Disp: 30 mL, Rfl: 5   cetirizine (ZYRTEC) 10 MG tablet, Take 1 tablet (10 mg total) by mouth daily., Disp: 30 tablet, Rfl: 5   cromolyn (OPTICROM) 4 % ophthalmic solution, Place 2 drops into both eyes 4 (four) times daily., Disp: 10 mL, Rfl: 5   desonide (DESOWEN) 0.05 % cream, Apply topically 2 (two) times daily as needed., Disp: 30 g, Rfl: 5   desonide (DESOWEN) 0.05 % ointment, Apply 1 Application topically 2 (two) times daily., Disp: 15 g, Rfl: 0   doxycycline (VIBRAMYCIN) 100 MG capsule, Take 1 capsule (100 mg total) by mouth 2 (two) times daily., Disp: 14 capsule, Rfl: 0   EPIPEN 2-PAK 0.3 MG/0.3ML SOAJ injection, Inject 0.3 mg into the muscle as needed for anaphylaxis., Disp: 1 each, Rfl: 1   famotidine (PEPCID) 20 MG tablet, Take 1 tablet (20 mg total) by mouth 2 (two) times daily as needed for heartburn or  indigestion., Disp: 60 tablet, Rfl: 5   fluticasone (FLONASE) 50 MCG/ACT nasal spray, Place 1-2 sprays into both nostrils daily., Disp: 16 g, Rfl: 5   Iron, Ferrous Sulfate, 325 (65 Fe) MG TABS, Take 325 mg by mouth daily., Disp: 90 tablet, Rfl: 1   montelukast (SINGULAIR) 10 MG tablet, Take 1 tablet (10 mg total) by mouth at bedtime., Disp: 30 tablet, Rfl: 5   NEXIUM 20 MG packet, Take 20 mg by mouth daily before breakfast., Disp: 30 each, Rfl: 12   norethindrone-ethinyl estradiol-FE (LOESTRIN FE 1/20) 1-20 MG-MCG tablet, Take 1 tablet by mouth daily., Disp: 84 tablet, Rfl: 3   ondansetron (ZOFRAN) 4 MG tablet, Take 1 tablet (4 mg total) by mouth every 6 (six) hours as needed for nausea or vomiting., Disp: 12 tablet, Rfl: 0   SUMAtriptan (IMITREX) 25 MG tablet, Take 1 tablet (25 mg total) by mouth every 2 (two) hours as needed for migraine. May repeat in 2 hours if headache persists or recurs., Disp: 10 tablet, Rfl: 0   SYMBICORT 160-4.5 MCG/ACT inhaler, Inhale 2 puffs into the lungs 2 (two) times daily., Disp: 10.2 g, Rfl: 5   triamcinolone ointment (KENALOG) 0.1 %, Apply 1 Application topically 2 (two) times daily as needed., Disp: 80 g, Rfl: 5   VENTOLIN HFA 108 (90 Base) MCG/ACT inhaler, Inhale 2 puffs into the lungs every 4 (four) hours as needed for wheezing or shortness of breath., Disp: 18 g, Rfl: 1   Medications  ordered in this encounter:  Meds ordered this encounter  Medications   amoxicillin-clavulanate (AUGMENTIN) 400-57 MG/5ML suspension    Sig: Take 10 mL by mouth twice daily for 7 day.    Dispense:  140 mL    Refill:  0    Order Specific Question:   Supervising Provider    Answer:   Merrilee Jansky [[id number]]     *If you need refills on other medications prior to your next appointment, please contact your pharmacy*  Follow-Up: Call back or seek an in-person evaluation if the symptoms worsen or if the condition fails to improve as anticipated.  [facility]  [phone]  Other Instructions Please finish the entire course of Doxycycline.  Start the liquid Augmentin as directed. Can continue Mucinex-DM OTC to help thin congestion and help with cough. If symptoms are not continuing to resolve or you note any new/worsening symptoms, you need an in-person evaluation ASAP.   If you have been instructed to have an in-person evaluation today at a local Urgent Care facility, please use the link below. It will take you to a list of all of our available [facility], including address, phone number and hours of operation. Please do not delay care.  [facility]  If you or a family member do not have a primary care provider, use the link below to schedule a visit and establish care. When you choose a [facility] primary care physician or advanced practice provider, you gain a long-term partner in health. Find a Primary Care Provider  Learn more about [facility]'s in-office and virtual care options: [facility] - Get Care Now

## 2023-07-14 NOTE — Progress Notes (Signed)
Virtual Visit Consent   Jean Frank, you are scheduled for a virtual visit with a [facility] provider today. Just as with appointments in the office, your consent must be obtained to participate. Your consent will be active for this visit and any virtual visit you may have with one of our providers in the next 365 days. If you have a MyChart account, a copy of this consent can be sent to you electronically.  As this is a virtual visit, video technology does not allow for your provider to perform a traditional examination. This may limit your provider's ability to fully assess your condition. If your provider identifies any concerns that need to be evaluated in person or the need to arrange testing (such as labs, EKG, etc.), we will make arrangements to do so. Although advances in technology are sophisticated, we cannot ensure that it will always work on either your end or our end. If the connection with a video visit is poor, the visit may have to be switched to a telephone visit. With either a video or telephone visit, we are not always able to ensure that we have a secure connection.  By engaging in this virtual visit, you consent to the provision of healthcare and authorize for your insurance to be billed (if applicable) for the services provided during this visit. Depending on your insurance coverage, you may receive a charge related to this service.  I need to obtain your verbal consent now. Are you willing to proceed with your visit today? Jean Frank has provided verbal consent on 07/14/2023 for a virtual visit (video or telephone). Jean Frank, [place]  Date: 07/14/2023 3:17 PM  Virtual Visit via Video Note   I, Jean Frank, connected with  Jean Frank  ([id number], 2001-05-20) on 07/14/23 at  3:15 PM EST by a video-enabled telemedicine application and verified that I am speaking with the correct person using two identifiers.  Location: Patient: Virtual Visit  Location Patient: Home Provider: Virtual Visit Location Provider: Home Office   I discussed the limitations of evaluation and management by telemedicine and the availability of in person appointments. The patient expressed understanding and agreed to proceed.    History of Present Illness: Jean Frank is a 22 y.o. who identifies as a female who was assigned female at birth, and is being seen today for request of adjustment in medication. Patient recently evaluated in ER on 11/19 and diagnosed with concern for CAP. Was started on Augmentin and Doxycycline. Endorses taking Doxycycline as directed. Has attempted to take her Augmentin as directed but the tablets are too large and causing her to get choked. In terms of symptoms she notes resolution of fever and substantially improved energy levels. Cough is still present and productive but improving. Denies chest pain or SOB. 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   Seasonal allergic conjunctivitis 04/19/2021   Intrinsic atopic dermatitis 04/19/2021   Migraine without aura and without status migrainosus, not intractable 11/01/2019   Asthma 08/17/2019   Chronic idiopathic urticaria 08/17/2019   Gastroesophageal  reflux disease 07/20/2017   Allergic rhinitis 07/30/2016   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 ., Disp: 30 mL, Rfl: 5   cetirizine (ZYRTEC) 10 MG tablet, Take 1 tablet (10 mg total) by mouth daily., Disp: 30 tablet, Rfl: 5   cromolyn (OPTICROM) 4 % ophthalmic solution, Place 2 drops into both eyes 4 (four) times daily., Disp: 10 mL, Rfl: 5   desonide (DESOWEN) 0.05 % cream, Apply topically 2 (two) times daily as needed., Disp: 30 g, Rfl: 5   desonide (DESOWEN) 0.05 % ointment, Apply 1 Application topically 2 (two) times daily., Disp: 15 g, Rfl: 0   doxycycline (VIBRAMYCIN) 100 MG capsule, Take 1 capsule (100 mg total) by mouth 2 (two) times daily., Disp: 14 capsule, Rfl: 0   EPIPEN 2-PAK 0.3 MG/0.3ML SOAJ injection, Inject 0.3 mg into the muscle as needed for anaphylaxis., Disp: 1 each, Rfl: 1   famotidine (PEPCID) 20 MG tablet, Take 1 tablet (20 mg total) by mouth 2 (two) times daily as needed for heartburn or indigestion., Disp: 60 tablet, Rfl: 5   fluticasone (FLONASE) 50 MCG/ACT nasal spray, Place 1-2 sprays into both nostrils daily., Disp: 16 g, Rfl: 5   Iron, Ferrous Sulfate, 325 (65 Fe) MG TABS, Take 325 mg by mouth daily., Disp: 90 tablet, Rfl: 1   montelukast (SINGULAIR) 10 MG tablet, Take 1 tablet (10 mg total) by mouth at bedtime., Disp: 30 tablet, Rfl: 5   NEXIUM 20 MG packet, Take 20 mg by mouth daily before breakfast., Disp: 30 each, Rfl: 12   norethindrone-ethinyl estradiol-FE (LOESTRIN FE 1/20) 1-20 MG-MCG tablet, Take 1 tablet by mouth daily., Disp: 84 tablet, Rfl: 3   ondansetron (ZOFRAN) 4 MG tablet, Take 1 tablet (4 mg total) by mouth every 6 (six) hours as needed for nausea or vomiting., Disp: 12 tablet, Rfl: 0   SUMAtriptan (IMITREX) 25 MG  tablet, Take 1 tablet (25 mg total) by mouth every 2 (two) hours as needed for migraine. May repeat in 2 hours if headache persists or recurs., Disp: 10 tablet, Rfl: 0   SYMBICORT 160-4.5 MCG/ACT inhaler, Inhale 2 puffs into the lungs 2 (two) times daily., Disp: 10.2 g, Rfl: 5   triamcinolone ointment (KENALOG) 0.1 %, Apply 1 Application topically 2 (two) times daily as needed., Disp: 80 g, Rfl: 5   VENTOLIN HFA 108 (90 Base) MCG/ACT inhaler, Inhale 2 puffs into the lungs every 4 (four) hours as needed for wheezing or shortness of breath., Disp: 18 g, Rfl: 1  Observations/Objective: Patient is well-developed, well-nourished in no acute distress.  Resting comfortably  at home.  Head is normocephalic, atraumatic.  No labored breathing.  Speech is clear and coherent with logical content.  Patient is alert and oriented at baseline.   Assessment and Plan: 1. Community acquired pneumonia, unspecified laterality - amoxicillin-clavulanate (AUGMENTIN) 400-57 MG/5ML suspension; Take 10 mL by mouth twice daily for 7 day.  Dispense: 140 mL; Refill: 0  Will switch Augmentin to liquid suspension to improve tolerability. Finish course of Doxycycline. Supportive measures and OTC medications reviewed. ER precautions discussed.  Follow Up Instructions: I discussed the assessment and treatment plan with the patient. The patient was provided an opportunity to ask questions and all were answered. The patient agreed with the plan and demonstrated an understanding of the instructions.  A copy of instructions were sent to the patient via MyChart unless otherwise noted below.   The patient was advised to call back or seek an in-person evaluation if the symptoms worsen or if the condition fails to improve as anticipated.    Jean Climes, PA-C

## 2023-08-10 ENCOUNTER — Other Ambulatory Visit: Payer: Self-pay

## 2023-08-10 ENCOUNTER — Ambulatory Visit (INDEPENDENT_AMBULATORY_CARE_PROVIDER_SITE_OTHER): Payer: Medicaid Other | Admitting: Family Medicine

## 2023-08-10 ENCOUNTER — Encounter: Payer: Self-pay | Admitting: Family Medicine

## 2023-08-10 DIAGNOSIS — L2084 Intrinsic (allergic) eczema: Secondary | ICD-10-CM

## 2023-08-10 DIAGNOSIS — J4540 Moderate persistent asthma, uncomplicated: Secondary | ICD-10-CM

## 2023-08-10 DIAGNOSIS — Z91013 Allergy to seafood: Secondary | ICD-10-CM

## 2023-08-10 DIAGNOSIS — T783XXD Angioneurotic edema, subsequent encounter: Secondary | ICD-10-CM

## 2023-08-10 DIAGNOSIS — R040 Epistaxis: Secondary | ICD-10-CM

## 2023-08-10 DIAGNOSIS — H1013 Acute atopic conjunctivitis, bilateral: Secondary | ICD-10-CM

## 2023-08-10 DIAGNOSIS — T7800XA Anaphylactic reaction due to unspecified food, initial encounter: Secondary | ICD-10-CM

## 2023-08-10 DIAGNOSIS — K219 Gastro-esophageal reflux disease without esophagitis: Secondary | ICD-10-CM

## 2023-08-10 DIAGNOSIS — L501 Idiopathic urticaria: Secondary | ICD-10-CM

## 2023-08-10 DIAGNOSIS — J3089 Other allergic rhinitis: Secondary | ICD-10-CM

## 2023-08-10 DIAGNOSIS — J302 Other seasonal allergic rhinitis: Secondary | ICD-10-CM

## 2023-08-10 DIAGNOSIS — H1010 Acute atopic conjunctivitis, unspecified eye: Secondary | ICD-10-CM

## 2023-08-10 MED ORDER — FLUTICASONE PROPIONATE 50 MCG/ACT NA SUSP: 1.0000 | Freq: Every day | NASAL | 5 refills | Status: DC

## 2023-08-10 MED ORDER — TRIAMCINOLONE ACETONIDE 0.1 % EX OINT: 1.0000 | TOPICAL_OINTMENT | Freq: Two times a day (BID) | CUTANEOUS | 5 refills | Status: AC | PRN

## 2023-08-10 MED ORDER — VENTOLIN HFA 108 (90 BASE) MCG/ACT IN AERS: 2.0000 | INHALATION_SPRAY | RESPIRATORY_TRACT | 2 refills | Status: DC | PRN

## 2023-08-10 MED ORDER — SYMBICORT 160-4.5 MCG/ACT IN AERO: 2.0000 | INHALATION_SPRAY | Freq: Two times a day (BID) | RESPIRATORY_TRACT | 5 refills | Status: AC

## 2023-08-10 MED ORDER — CROMOLYN SODIUM 4 % OP SOLN: 2.0000 [drp] | Freq: Four times a day (QID) | OPHTHALMIC | 5 refills | Status: DC

## 2023-08-10 MED ORDER — MONTELUKAST SODIUM 10 MG PO TABS: 10.0000 mg | ORAL_TABLET | Freq: Every day | ORAL | 5 refills | Status: AC

## 2023-08-10 MED ORDER — CETIRIZINE HCL 10 MG PO TABS: 10.0000 mg | ORAL_TABLET | Freq: Every day | ORAL | 5 refills | Status: AC

## 2023-08-10 MED ORDER — ALBUTEROL SULFATE HFA 108 (90 BASE) MCG/ACT IN AERS: 2.0000 | INHALATION_SPRAY | RESPIRATORY_TRACT | 1 refills | Status: AC | PRN

## 2023-08-10 MED ORDER — DESONIDE 0.05 % EX OINT: 1.0000 | TOPICAL_OINTMENT | Freq: Two times a day (BID) | CUTANEOUS | 2 refills | Status: DC

## 2023-08-10 NOTE — Patient Instructions (Addendum)
Asthma Moderately well controlled Continue Symbicort 160 to 2 puffs twice a day with a spacer to prevent cough or wheeze.  Continue montelukast 10 mg once a day to prevent cough or wheeze Continue albuterol 2 puffs every 4 hours as needed for cough or wheeze or albuterol 0.083% albuterol via nebulizer one unit vial every 4 hours as needed for cough or wheeze.  You may use albuterol 2 puffs 5-15 minutes before activity to decrease cough or wheeze  Allergic rhinitis Moderately well-controlled Your skin testing was positive to grass pollen, weed pollen, ragweed pollen, indoor mold, outdoor mold, dogs, cats, and cockroach Allergen avoidance measures provided Continue cetirizine 10 mg once a day as needed for runny nose or itch Continue fluticasone nasal spray 1-2 sprays in each nostril once a day as needed for a stuffy nose Consider saline nasal rinses as needed for nasal symptoms. Use this before any medicated nasal sprays for best result Consider allergen immunotherapy if your symptoms are not well-controlled with the treatment plan as listed above.  Written information provided  Allergic conjunctivitis Moderately well-controlled Continue cromolyn eye drops 1-2 drops in each eye up to 4 times a day as needed for red or itchy eyes Recommend use of a lubricating eyedrop such as blink or refresh.  Use this before using any medicated eyedrops  Hives (urticaria)/angioedema Well-controlled Use the least amount of medications while remaining hive free. Cetirizine (Zyrtec) 10mg  twice a day and famotidine (Pepcid) 20 mg twice a day. If no symptoms for 7-14 days then decrease to. Cetirizine (Zyrtec) 10mg  twice a day and famotidine (Pepcid) 20 mg once a day.  If no symptoms for 7-14 days then decrease to. Cetirizine (Zyrtec) 10mg  twice a day.  If no symptoms for 7-14 days then decrease to. Cetirizine (Zyrtec) 10mg  once a day.  May use Benadryl (diphenhydramine) as needed for breakthrough hives        If symptoms return, then step up dosage Keep a detailed symptom journal including foods eaten, contact with allergens, medications taken, weather changes.   Atopic dermatitis Not well-controlled Continue twice a day moisturizing routine For red itchy areas on your face and neck, begin desonide 0.05 ointment twice a day as needed. For red itchy areas below your face and neck, begin triamcinolone 0.1% ointment twice a day as needed. Keep your upcoming initial consultation appointment with your dermatologist Consider Dupixent for relief of atopic dermatitis  Reflux Well-controlled Continue famotidine 20 mg twice a day for reflux.  Continue dietary and lifestyle modifications as listed below  Food allergy Stable Continue to avoid shellfish and pecans in case of an allergic reaction, take Benadryl 50 mg every 4 hours, and if life-threatening symptoms occur, inject with EpiPen 0.3 mg.  Epistaxis Stable Pinch both nostrils while leaning forward for at least 5 minutes before checking to see if the bleeding has stopped. If bleeding is not controlled within 5-10 minutes apply a cotton ball soaked with oxymetazoline (Afrin) to the bleeding nostril for a few seconds.  If the problem persists or worsens a referral to ENT for further evaluation may be necessary.  Call the clinic if this treatment plan is not working well for you  Follow up in the clinic in 3 months or sooner if needed.  Reducing Pollen Exposure The American Academy of All[employer], [employer] and Immun[employer] suggests the following steps to reduce your exposure to pollen during allergy seasons. Do not hang sheets or clothing out to dry; pollen may collect on these items. Do not  mow lawns or spend time around freshly cut grass; mowing stirs up pollen. Keep windows closed at night.  Keep car windows closed while driving. Minimize morning activities outdoors, a time when pollen counts are usually at their highest. Stay indoors as much as  possible when pollen counts or humidity is high and on windy days when pollen tends to remain in the air longer. Use air conditioning when possible.  Many air conditioners have filters that trap the pollen spores. Use a HEPA room air filter to remove pollen form the indoor air you breathe.   Control of Dust Mite Allergen Dust mites play a major role in allergic asthma and rhinitis. They occur in environments with high humidity wherever human skin is found. Dust mites absorb humidity from the atmosphere (ie, they do not drink) and feed on organic matter (including shed human and animal skin). Dust mites are a microscopic type of insect that you cannot see with the naked eye. High levels of dust mites have been detected from mattresses, pillows, carpets, upholstered furniture, bed covers, clothes, soft toys and any woven material. The principal allergen of the dust mite is found in its feces. A gram of dust may contain 1,000 mites and 250,000 fecal particles. Mite antigen is easily measured in the air during house cleaning activities. Dust mites do not bite and do not cause harm to humans, other than by triggering allergies/asthma.  Ways to decrease your exposure to dust mites in your home:  1. Encase mattresses, box springs and pillows with a mite-impermeable barrier or cover  2. Wash sheets, blankets and drapes weekly in hot water (130 F) with detergent and dry them in a dryer on the hot setting.  3. Have the room cleaned frequently with a vacuum cleaner and a damp dust-mop. For carpeting or rugs, vacuuming with a vacuum cleaner equipped with a high-efficiency particulate air (HEPA) filter. The dust mite allergic individual should not be in a room which is being cleaned and should wait 1 hour after cleaning before going into the room.  4. Do not sleep on upholstered furniture (eg, couches).  5. If possible removing carpeting, upholstered furniture and drapery from the home is ideal. Horizontal  blinds should be eliminated in the rooms where the person spends the most time (bedroom, study, television room). Washable vinyl, roller-type shades are optimal.  6. Remove all non-washable stuffed toys from the bedroom. Wash stuffed toys weekly like sheets and blankets above.  7. Reduce indoor humidity to less than 50%. Inexpensive humidity monitors can be purchased at most hardware stores. Do not use a humidifier as can make the problem worse and are not recommended.  Control of Cockroach Allergen Cockroach allergen has been identified as an important cause of acute attacks of asthma, especially in urban settings.  There are fifty-five species of cockroach that exist in the [place], however only three, the [place], [place] species produce allergen that can affect patients with Asthma.  Allergens can be obtained from fecal particles, egg casings and secretions from cockroaches.    Remove food sources. Reduce access to water. Seal access and entry points. Spray runways with 0.5-1% Diazinon or Chlorpyrifos Blow boric acid power under stoves and refrigerator. Place bait stations (hydramethylnon) at feeding sites.    Lifestyle Changes for Controlling GERD When you have GERD, stomach acid feels as if it's backing up toward your mouth. Whether or not you take medication to control your GERD, your symptoms can often be improved with  lifestyle changes.   Raise Your Head Reflux is more likely to strike when you're lying down flat, because stomach fluid can flow backward more easily. Raising the head of your bed 4-6 inches can help. To do this: Slide blocks or books under the legs at the head of your bed. Or, place a wedge under the mattress. Many foam stores can make a suitable wedge for you. The wedge should run from your waist to the top of your head. Don't just prop your head on several pillows. This increases pressure on your stomach. It can make GERD worse.  Watch Your  Eating Habits Certain foods may increase the acid in your stomach or relax the lower esophageal sphincter, making GERD more likely. It's best to avoid the following: Coffee, tea, and carbonated drinks (with and without caffeine) Fatty, fried, or spicy food Mint, chocolate, onions, and tomatoes Any other foods that seem to irritate your stomach or cause you pain  Relieve the Pressure Eat smaller meals, even if you have to eat more often. Don't lie down right after you eat. Wait a few hours for your stomach to empty. Avoid tight belts and tight-fitting clothes. Lose excess weight.  Tobacco and Alcohol Avoid smoking tobacco and drinking alcohol. They can make GERD symptoms worse. Begin Symbicort

## 2023-08-10 NOTE — Progress Notes (Signed)
RE: Jean Frank MRN: [id number] DOB: May 02, 2001 Date of Telemedicine Visit: 08/10/2023  Referring provider: Gerre Scull, NP Primary care provider: Patient, No Pcp Per  Chief Complaint: Follow-up (Asthma been fine with the new inhalers. Face and neck issues (got in the ocean while on vacation in [place]) dry spots on face and neck.)   Telemedicine Follow Up Visit via Telephone: I connected with Jean Frank for a follow up on 08/10/23 by telephone and verified that I am speaking with the correct person using two identifiers.   I discussed the limitations, risks, security and privacy concerns of performing an evaluation and management service by telephone and the availability of in person appointments. I also discussed with the patient that there may be a patient responsible charge related to this service. The patient expressed understanding and agreed to proceed.  Patient is at home  Provider is at the office.  Visit start time: 936 Visit end time: 24 Insurance consent/check in by: Vernona Rieger Medical consent and medical assistant/nurse: Trayce  History of Present Illness: She is a 22 y.o. female, who is being followed for asthma, allergic rhinitis, urticaria, angioedema, atopic dermatitis, epistaxis, and food allergy to shellfish, peanut, and tree nuts. Her previous allergy office visit was on 05/07/2023 with Thermon Leyland, FNP.  In the interim, on 07/07/2023 she visited the emergency department where she was diagnosed with community-acquired pneumonia and started on amoxicillin and doxycycline.  X-ray result listed below.  At today's visit, she reports her asthma has been moderately well-controlled with intermittent cough producing clear mucus.  She reports this cough is improving each day.  She denies other asthma symptoms including shortness of breath or wheeze with activity or rest.  She continues Symbicort 160-2 puffs twice a day with a spacer and uses albuterol about 2 to 3 days  a week with relief of symptoms.  She reports that she has previously taken montelukast with some relief of asthma symptoms, however, she is currently out of this medication.  Allergic rhinitis is reported as moderately well-controlled with occasional nasal congestion and dry nostrils as the main symptoms.  She continues cetirizine 10 mg once a day, Flonase as needed, and is not using nasal saline rinses or nasal saline gel. Her last environmental allergy skin testing was on 04/19/2021 and was positive to pollens, dust mite, dog, and cockroach.   Allergic conjunctivitis is reported as moderately well-controlled with occasional red and itchy eyes for which she uses cromolyn eyedrops with relief of symptoms.  She denies urticaria or angioedema since her last visit to this clinic.  She continues cetirizine 10 mg once a day.  Atopic dermatitis is reported as poorly controlled with red and itchy areas occurring in a flare in remission pattern mainly on her stomach and legs.  She continues a twice a day moisturizing routine with Vaseline.  She has previously used budesonide and triamcinolone, however, she is out of both of these medications.  She reports that she has an initial consult with a dermatology specialist in March 2025.  She reports a new problem at today's visit that occurred about 2 weeks ago the day after she returned from [place].  She reports that while in [place] they were on a Jet[employer]ki and the following day she developed dry and itchy areas on her face, ears, and neck.  She reports these areas are somewhat itchy.  She continues Vaseline twice a day with no improvement in redness or itch.  She denies drainage from these areas.  She denies new personal care products, new foods, new medications, or insect stings.  She denies any concomitant cardiopulmonary or gastrointestinal symptoms with this new rash.  She denies raised areas.  She denies any recent epistaxis.  She is occasionally using Flonase and is  not currently using nasal saline rinses or nasal saline gel.  Reflux is reported as well-controlled with no symptoms including heartburn or vomiting.  She continues famotidine twice a day with relief of symptoms.  She continues to avoid shellfish, peanuts, and tree nuts with no accidental ingestion or EpiPen use since her last visit to this clinic. Her last food allergy testing was on 04/19/2021 and was positive to shrimp.  EpiPen set is up-to-date.  Current medications are listed in the chart.   EXAM: 07/07/2023 PORTABLE CHEST 1 VIEW COMPARISON:  03/17/2022 FINDINGS: The heart size and mediastinal contours are within normal limits. Both lungs are clear. The visualized skeletal structures are unremarkable.  IMPRESSION: Normal study  Electronically Signed   By: Charlett Nose M.D.   On: 07/07/2023 23:08  Assessment and Plan: Jassmine is a 22 y.o. female with: Patient Instructions  Asthma Moderately well controlled Continue Symbicort 160- 2 puffs twice a day with a spacer to prevent cough or wheeze.  Restart montelukast 10 mg once a day to prevent cough or wheeze Continue albuterol 2 puffs every 4 hours as needed for cough or wheeze or albuterol 0.083% albuterol via nebulizer one unit vial every 4 hours as needed for cough or wheeze.  You may use albuterol 2 puffs 5-15 minutes before activity to decrease cough or wheeze  Allergic rhinitis Moderately well-controlled Continue allergen avoidance measures directed toward grass pollen, weed pollen, ragweed pollen, indoor mold, outdoor mold, dogs, cats, and cockroach as listed below Continue cetirizine 10 mg once a day as needed for runny nose or itch Continue fluticasone nasal spray 1-2 sprays in each nostril once a day as needed for a stuffy nose.  In the right nostril, point the applicator out toward the right ear. In the left nostril, point the applicator out toward the left ear.  Stop Flonase if you develop any nasal bleeding Consider  saline nasal rinses as needed for nasal symptoms. Use this before any medicated nasal sprays for best result Consider allergen immunotherapy if your symptoms are not well-controlled with the treatment plan as listed above.    Allergic conjunctivitis Moderately well-controlled Continue cromolyn eye drops 1-2 drops in each eye up to 4 times a day as needed for red or itchy eyes Recommend use of a lubricating eyedrop such as blink or refresh.  Use this before using any medicated eyedrops  Hives (urticaria)/angioedema Well-controlled Use the least amount of medications while remaining hive free. Cetirizine (Zyrtec) 10mg  twice a day and famotidine (Pepcid) 20 mg twice a day. If no symptoms for 7-14 days then decrease to. Cetirizine (Zyrtec) 10mg  twice a day and famotidine (Pepcid) 20 mg once a day.  If no symptoms for 7-14 days then decrease to. Cetirizine (Zyrtec) 10mg  twice a day.  If no symptoms for 7-14 days then decrease to. Cetirizine (Zyrtec) 10mg  once a day.  May use Benadryl (diphenhydramine) as needed for breakthrough hives       If symptoms return, then step up dosage Keep a detailed symptom journal including foods eaten, contact with allergens, medications taken, weather changes.   Atopic dermatitis Not well-controlled Continue twice a day moisturizing routine For red itchy areas on your face and neck, begin desonide 0.05 ointment twice  a day as needed. For red itchy areas below your face and neck, begin triamcinolone 0.1% ointment twice a day as needed. Keep your upcoming initial consultation appointment with your dermatologist Consider Dupixent for relief of atopic dermatitis  Reflux Well-controlled Continue famotidine 20 mg twice a day for reflux.  Continue dietary and lifestyle modifications as listed below  Food allergy Stable Continue to avoid shellfish and pecans in case of an allergic reaction, take Benadryl 50 mg every 4 hours, and if life-threatening symptoms  occur, inject with EpiPen 0.3 mg.  Consider updating your food allergy skin testing.  Remember to stop antihistamines for 3 days before your skin testing appointment.  Epistaxis Stable Pinch both nostrils while leaning forward for at least 5 minutes before checking to see if the bleeding has stopped. If bleeding is not controlled within 5-10 minutes apply a cotton ball soaked with oxymetazoline (Afrin) to the bleeding nostril for a few seconds.  If the problem persists or worsens a referral to ENT for further evaluation may be necessary.  Call the clinic if this treatment plan is not working well for you  Follow up in the clinic in 3 months or sooner if needed.   Return in about 3 months (around 11/08/2023), or if symptoms worsen or fail to improve.  Meds ordered this encounter  Medications   cetirizine (ZYRTEC) 10 MG tablet    Sig: Take 1 tablet (10 mg total) by mouth daily.    Dispense:  30 tablet    Refill:  5   cromolyn (OPTICROM) 4 % ophthalmic solution    Sig: Place 2 drops into both eyes 4 (four) times daily.    Dispense:  10 mL    Refill:  5   fluticasone (FLONASE) 50 MCG/ACT nasal spray    Sig: Place 1-2 sprays into both nostrils daily.    Dispense:  16 g    Refill:  5   montelukast (SINGULAIR) 10 MG tablet    Sig: Take 1 tablet (10 mg total) by mouth at bedtime.    Dispense:  30 tablet    Refill:  5   SYMBICORT 160-4.5 MCG/ACT inhaler    Sig: Inhale 2 puffs into the lungs 2 (two) times daily.    Dispense:  10.2 g    Refill:  5   triamcinolone ointment (KENALOG) 0.1 %    Sig: Apply 1 Application topically 2 (two) times daily as needed.    Dispense:  80 g    Refill:  5   VENTOLIN HFA 108 (90 Base) MCG/ACT inhaler    Sig: Inhale 2 puffs into the lungs every 4 (four) hours as needed for wheezing or shortness of breath.    Dispense:  18 g    Refill:  2   albuterol (PROVENTIL HFA) 108 (90 Base) MCG/ACT inhaler    Sig: Inhale 2 puffs into the lungs every 4 (four) hours  as needed for wheezing or shortness of breath.    Dispense:  18 g    Refill:  1    One for home and school.   desonide (DESOWEN) 0.05 % ointment    Sig: Apply 1 Application topically 2 (two) times daily.    Dispense:  15 g    Refill:  2    Medication List:  Current Outpatient Medications  Medication Sig Dispense Refill   amoxicillin-clavulanate (AUGMENTIN) 400-57 MG/5ML suspension Take 10 mL by mouth twice daily for 7 day. 140 mL 0   azelastine (ASTELIN) 0.1 %  nasal spray 2 sprays each nostril 1-2 times daily prn. . 30 mL 5   desonide (DESOWEN) 0.05 % cream Apply topically 2 (two) times daily as needed. 30 g 5   doxycycline (VIBRAMYCIN) 100 MG capsule Take 1 capsule (100 mg total) by mouth 2 (two) times daily. 14 capsule 0   EPIPEN 2-PAK 0.3 MG/0.3ML SOAJ injection Inject 0.3 mg into the muscle as needed for anaphylaxis. 1 each 1   famotidine (PEPCID) 20 MG tablet Take 1 tablet (20 mg total) by mouth 2 (two) times daily as needed for heartburn or indigestion. 60 tablet 5   Iron, Ferrous Sulfate, 325 (65 Fe) MG TABS Take 325 mg by mouth daily. 90 tablet 1   NEXIUM 20 MG packet Take 20 mg by mouth daily before breakfast. 30 each 12   norethindrone-ethinyl estradiol-FE (LOESTRIN FE 1/20) 1-20 MG-MCG tablet Take 1 tablet by mouth daily. 84 tablet 3   ondansetron (ZOFRAN) 4 MG tablet Take 1 tablet (4 mg total) by mouth every 6 (six) hours as needed for nausea or vomiting. 12 tablet 0   SUMAtriptan (IMITREX) 25 MG tablet Take 1 tablet (25 mg total) by mouth every 2 (two) hours as needed for migraine. May repeat in 2 hours if headache persists or recurs. 10 tablet 0   albuterol (PROVENTIL HFA) 108 (90 Base) MCG/ACT inhaler Inhale 2 puffs into the lungs every 4 (four) hours as needed for wheezing or shortness of breath. 18 g 1   cetirizine (ZYRTEC) 10 MG tablet Take 1 tablet (10 mg total) by mouth daily. 30 tablet 5   cromolyn (OPTICROM) 4 % ophthalmic solution Place 2 drops into both eyes 4 (four)  times daily. 10 mL 5   desonide (DESOWEN) 0.05 % ointment Apply 1 Application topically 2 (two) times daily. 15 g 2   fluticasone (FLONASE) 50 MCG/ACT nasal spray Place 1-2 sprays into both nostrils daily. 16 g 5   montelukast (SINGULAIR) 10 MG tablet Take 1 tablet (10 mg total) by mouth at bedtime. 30 tablet 5   SYMBICORT 160-4.5 MCG/ACT inhaler Inhale 2 puffs into the lungs 2 (two) times daily. 10.2 g 5   triamcinolone ointment (KENALOG) 0.1 % Apply 1 Application topically 2 (two) times daily as needed. 80 g 5   VENTOLIN HFA 108 (90 Base) MCG/ACT inhaler Inhale 2 puffs into the lungs every 4 (four) hours as needed for wheezing or shortness of breath. 18 g 2   No current facility-administered medications for this visit.   Allergies: Allergies  Allergen Reactions   Shellfish Allergy    I reviewed her past medical history, social history, family history, and environmental history and no significant changes have been reported from previous visit on 05/07/2023.  Objective: Physical Exam Not obtained as encounter was done via telephone.   Previous notes and tests were reviewed.  I discussed the assessment and treatment plan with the patient. The patient was provided an opportunity to ask questions and all were answered. The patient agreed with the plan and demonstrated an understanding of the instructions.   The patient was advised to call back or seek an in-person evaluation if the symptoms worsen or if the condition fails to improve as anticipated.  I provided 22 minutes of non-face-to-face time during this encounter.  It was my pleasure to participate in [place]'s care today. Please feel free to contact me with any questions or concerns.   Sincerely,  Thermon Leyland, FNP

## 2023-09-14 ENCOUNTER — Encounter: Payer: Self-pay | Admitting: Family Medicine

## 2023-09-14 ENCOUNTER — Other Ambulatory Visit: Payer: Self-pay | Admitting: Family Medicine

## 2023-09-14 MED ORDER — TRIAMCINOLONE ACETONIDE 0.1 % EX CREA: 1.0000 | TOPICAL_CREAM | Freq: Two times a day (BID) | CUTANEOUS | 0 refills | Status: AC

## 2023-09-14 NOTE — Progress Notes (Signed)
Use triamcinolone 0.1% cream to red and itchy areas under your face up to twice a day if needed. This will replace triamcinolone ointment

## 2023-09-14 NOTE — Telephone Encounter (Signed)
Can you please let this patient know that I have ordered triamcinolone 0.1% cream.  She can use this medication twice a day to red and itchy areas underneath her face.  Do not use longer than 2 weeks in a row.  This will replace triamcinolone ointment.  Thank you

## 2023-11-11 ENCOUNTER — Ambulatory Visit: Payer: Medicaid Other | Admitting: Dermatology

## 2023-12-09 ENCOUNTER — Ambulatory Visit: Admitting: Family

## 2023-12-28 ENCOUNTER — Encounter (HOSPITAL_BASED_OUTPATIENT_CLINIC_OR_DEPARTMENT_OTHER): Admitting: Certified Nurse Midwife

## 2024-01-11 ENCOUNTER — Other Ambulatory Visit: Payer: Self-pay

## 2024-01-11 ENCOUNTER — Emergency Department (HOSPITAL_BASED_OUTPATIENT_CLINIC_OR_DEPARTMENT_OTHER)
Admission: EM | Admit: 2024-01-11 | Discharge: 2024-01-11 | Disposition: A | Discharge status: Home / Self Care | Attending: Emergency Medicine | Admitting: Emergency Medicine

## 2024-01-11 ENCOUNTER — Emergency Department (HOSPITAL_BASED_OUTPATIENT_CLINIC_OR_DEPARTMENT_OTHER): Admitting: Radiology

## 2024-01-11 ENCOUNTER — Encounter (HOSPITAL_BASED_OUTPATIENT_CLINIC_OR_DEPARTMENT_OTHER): Payer: Self-pay

## 2024-01-11 DIAGNOSIS — R042 Hemoptysis: Secondary | ICD-10-CM | POA: Insufficient documentation

## 2024-01-11 DIAGNOSIS — J45909 Unspecified asthma, uncomplicated: Secondary | ICD-10-CM | POA: Diagnosis not present

## 2024-01-11 DIAGNOSIS — R059 Cough, unspecified: Secondary | ICD-10-CM | POA: Diagnosis present

## 2024-01-11 DIAGNOSIS — J40 Bronchitis, not specified as acute or chronic: Secondary | ICD-10-CM

## 2024-01-11 DIAGNOSIS — Z7951 Long term (current) use of inhaled steroids: Secondary | ICD-10-CM | POA: Insufficient documentation

## 2024-01-11 LAB — BASIC METABOLIC PANEL WITH GFR
Anion gap: 13 (ref 5–15)
BUN: 13 mg/dL (ref 6–20)
CO2: 23 mmol/L (ref 22–32)
Calcium: 9.9 mg/dL (ref 8.9–10.3)
Chloride: 102 mmol/L (ref 98–111)
Creatinine, Ser: 0.65 mg/dL (ref 0.44–1.00)
GFR, Estimated: >60 mL/min (ref >60)
Glucose, Bld: 91 mg/dL (ref 70–99)
Potassium: 3.8 mmol/L (ref 3.5–5.1)
Sodium: 138 mmol/L (ref 135–145)

## 2024-01-11 LAB — TROPONIN T, HIGH SENSITIVITY: Troponin T High Sensitivity: <15 ng/L (ref <19)

## 2024-01-11 LAB — CBC WITH DIFFERENTIAL/PLATELET
Abs Immature Granulocytes: 0.03 10*3/uL (ref 0.00–0.07)
Basophils Absolute: 0 10*3/uL (ref 0.0–0.1)
Basophils Relative: 0 %
Eosinophils Absolute: 0.1 10*3/uL (ref 0.0–0.5)
Eosinophils Relative: 2 %
HCT: 34.4 % — ABNORMAL LOW (ref 36.0–46.0)
Hemoglobin: 10.3 g/dL — ABNORMAL LOW (ref 12.0–15.0)
Immature Granulocytes: 0 %
Lymphocytes Relative: 15 %
Lymphs Abs: 1.3 10*3/uL (ref 0.7–4.0)
MCH: 21.8 pg — ABNORMAL LOW (ref 26.0–34.0)
MCHC: 29.9 g/dL — ABNORMAL LOW (ref 30.0–36.0)
MCV: 72.9 fL — ABNORMAL LOW (ref 80.0–100.0)
Monocytes Absolute: 0.9 10*3/uL (ref 0.1–1.0)
Monocytes Relative: 10 %
Neutro Abs: 6.2 10*3/uL (ref 1.7–7.7)
Neutrophils Relative %: 73 %
Platelets: 271 10*3/uL (ref 150–400)
RBC: 4.72 MIL/uL (ref 3.87–5.11)
RDW: 16.2 % — ABNORMAL HIGH (ref 11.5–15.5)
WBC: 8.5 10*3/uL (ref 4.0–10.5)
nRBC: 0 % (ref 0.0–0.2)

## 2024-01-11 LAB — PREGNANCY, URINE: Preg Test, Ur: NEGATIVE

## 2024-01-11 LAB — RESP PANEL BY RT-PCR (RSV, FLU A&B, COVID)  RVPGX2
Influenza A by PCR: NEGATIVE
Influenza B by PCR: NEGATIVE
Resp Syncytial Virus by PCR: NEGATIVE
SARS Coronavirus 2 by RT PCR: NEGATIVE

## 2024-01-11 LAB — D-DIMER, QUANTITATIVE: D-Dimer, Quant: 0.32 ug{FEU}/mL (ref 0.00–0.50)

## 2024-01-11 MED ORDER — ALBUTEROL SULFATE HFA 108 (90 BASE) MCG/ACT IN AERS
2.0000 | INHALATION_SPRAY | Freq: Once | RESPIRATORY_TRACT | Status: AC
  Administered 2024-01-11: 2 via RESPIRATORY_TRACT
  Filled 2024-01-11: qty 6.7

## 2024-01-11 MED ORDER — DEXAMETHASONE 4 MG PO TABS
4.0000 mg | ORAL_TABLET | Freq: Once | ORAL | Status: AC
  Administered 2024-01-11: 4 mg via ORAL
  Filled 2024-01-11: qty 1

## 2024-01-11 NOTE — Discharge Instructions (Signed)
 While you were in the emergency room, you had blood work done that was normal.  We checked for signs of injury to your heart, lungs, and for blood clots.  These test were all negative.  You likely have bronchitis.  I have given you a single dose of a steroid here.  I have also sent you with an albuterol  inhaler.  Symptoms should begin to improve over the next 1 week.

## 2024-01-11 NOTE — ED Notes (Signed)
 Pt discharged home and given discharge paperwork. Opportunities given for questions. Pt verbalizes understanding. PIV removed x1. Jillyn Hidden , RN

## 2024-01-11 NOTE — ED Triage Notes (Signed)
 PT reports cough and chest wall pain x1 week. Pt reports increased SOB when laying flat. Pt also reports congestion and productive cough. Pt reports coughing up blood clot this AM.

## 2024-01-11 NOTE — ED Provider Notes (Signed)
 [place] EMERGENCY DEPARTMENT AT [facility] Provider Note  CSN: [id number] Arrival date & time: 01/11/24 1002  Chief Complaint(s) Cough, Chest Pain, and Shortness of Breath  HPI Miryah Ralls is a 23 y.o. female who is here today for 5 days of cough, chest pain.  Patient reported 1 episode of hemoptysis.  Patient denies any fever or chills.  Has not had any nasal congestion.  Denies any recent travel, not on any oral contraceptives.   Past Medical History Past Medical History:  Diagnosis Date   Allergy     Asthma    Eczema    GERD (gastroesophageal reflux disease)    Migraine    Prediabetes    Shellfish allergy     Patient Active Problem List   Diagnosis Date Noted   Epistaxis 05/04/2023   Angio-edema 05/04/2023   Menorrhagia with irregular cycle 01/15/2023   Elevated blood pressure reading 04/16/2022   Anemia 03/19/2022   Breast pain 02/12/2022   Chronic midline thoracic back pain 12/18/2021   Prediabetes 12/18/2021   Not well controlled moderate persistent asthma 06/21/2021   Seasonal and perennial allergic rhinitis 06/21/2021   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 Authorizing Provider  albuterol  (PROVENTIL  HFA) 108 (90 Base) MCG/ACT inhaler Inhale 2 puffs into the lungs every 4 (four) hours as needed for wheezing or shortness of breath. 08/10/23   Ardie Kras, FNP  amoxicillin -clavulanate (AUGMENTIN ) 400-57 MG/5ML suspension Take 10 mL by mouth twice daily for 7 day. 07/14/23   Farris Hong, PA-C  azelastine   (ASTELIN ) 0.1 % nasal spray 2 sprays each nostril 1-2 times daily prn. . 07/08/22   Ardie Kras, FNP  cetirizine  (ZYRTEC ) 10 MG tablet Take 1 tablet (10 mg total) by mouth daily. 08/10/23   Ardie Kras, FNP  cromolyn  (OPTICROM ) 4 % ophthalmic solution Place 2 drops into both eyes 4 (four) times daily. 08/10/23   Ardie Kras, FNP  desonide  (DESOWEN ) 0.05 % cream Apply topically 2 (two) times daily as needed. 07/08/22   Ardie Kras, FNP  desonide  (DESOWEN ) 0.05 % ointment Apply 1 Application topically 2 (two) times daily. 08/10/23   Ardie Kras, FNP  doxycycline  (VIBRAMYCIN ) 100 MG capsule Take 1 capsule (100 mg total) by mouth 2 (two) times daily. 07/07/23   Harris, Luane Rumps, PA-C  EPIPEN  2-PAK 0.3 MG/0.3ML SOAJ injection Inject 0.3 mg into the muscle as needed for anaphylaxis. 05/04/23   Ardie Kras, FNP  famotidine  (PEPCID ) 20 MG tablet Take 1 tablet (20 mg total) by mouth 2 (two) times daily as needed for heartburn or indigestion. 05/04/23   Ardie Kras, FNP  fluticasone  (FLONASE ) 50 MCG/ACT nasal spray Place 1-2 sprays into both nostrils daily. 08/10/23   Ambs, Jeanmarie Millet, FNP  Iron , Ferrous Sulfate , 325 (65 Fe) MG TABS Take 325 mg by mouth daily. 03/05/23   McElwee, Adolfo Hooker, NP  montelukast  (SINGULAIR ) 10 MG tablet Take 1 tablet (10 mg total) by mouth at bedtime. 08/10/23   Ardie Kras, FNP  NEXIUM  20 MG packet Take 20 mg by mouth daily before breakfast. 09/03/22  McElwee, Lauren A, NP  norethindrone-ethinyl estradiol-FE (LOESTRIN FE 1/20) 1-20 MG-MCG tablet Take 1 tablet by mouth daily. 01/15/23   McElwee, Lauren A, NP  ondansetron  (ZOFRAN ) 4 MG tablet Take 1 tablet (4 mg total) by mouth every 6 (six) hours as needed for nausea or vomiting. 07/07/23   Harris, Abigail, PA-C  SUMAtriptan  (IMITREX ) 25 MG tablet Take 1 tablet (25 mg total) by mouth every 2 (two) hours as needed for migraine. May repeat in 2 hours if headache persists or recurs. 07/17/22   McElwee, Adolfo Hooker, NP  SYMBICORT  160-4.5  MCG/ACT inhaler Inhale 2 puffs into the lungs 2 (two) times daily. 08/10/23   Ardie Kras, FNP  triamcinolone  cream (KENALOG ) 0.1 % Apply 1 Application topically 2 (two) times daily. 09/14/23   Ardie Kras, FNP  triamcinolone  ointment (KENALOG ) 0.1 % Apply 1 Application topically 2 (two) times daily as needed. 08/10/23   Ardie Kras, FNP  VENTOLIN  HFA 108 (90 Base) MCG/ACT inhaler Inhale 2 puffs into the lungs every 4 (four) hours as needed for wheezing or shortness of breath. 08/10/23   Ardie Kras, FNP                                                                                                                                    Past Surgical History Past Surgical History:  Procedure Laterality Date   NO PAST SURGERIES     Family History Family History  Problem Relation Age of Onset   Hypertension Mother    Anxiety disorder Mother    Heart disease Father    Hypertension Father    Migraines Father        had migraines but years ago   Allergic rhinitis Brother    Asthma Brother    Diabetes Maternal Grandmother    Migraines Maternal Grandmother    Aneurysm Maternal Grandfather    Diabetes Paternal Grandmother    Angioedema Neg Hx    Eczema Neg Hx    Immunodeficiency Neg Hx    Urticaria Neg Hx    Seizures Neg Hx    Depression Neg Hx    Bipolar disorder Neg Hx    Schizophrenia Neg Hx    ADD / ADHD Neg Hx    Autism Neg Hx     Social History Social History   Tobacco Use   Smoking status: Never    Passive exposure: Never   Smokeless tobacco: Never  Vaping Use   Vaping status: Never Used  Substance Use Topics   Alcohol use: No   Drug use: No   Allergies Shellfish allergy   Review of Systems Review of Systems  Physical Exam Vital Signs  I have reviewed the triage vital signs BP 111/68 (BP Location: Left Arm)   Pulse (!) 116   Temp 98.5 F (36.9 C) (Oral)   Resp 20   Ht 5\' 6"  (1.676 m)   Wt 93  kg   LMP 12/30/2023   SpO2 100%   BMI 33.09 kg/m    Physical Exam Vitals and nursing note reviewed.  HENT:     Head: Normocephalic.  Cardiovascular:     Heart sounds: Normal heart sounds.  Pulmonary:     Effort: Pulmonary effort is normal.     Breath sounds: Normal breath sounds.  Abdominal:     Palpations: Abdomen is soft.  Musculoskeletal:        General: Normal range of motion.     Right lower leg: No edema.     Left lower leg: No edema.  Skin:    General: Skin is warm.  Neurological:     Mental Status: She is alert.     ED Results and Treatments Labs (all labs ordered are listed, but only abnormal results are displayed) Labs Reviewed  CBC WITH DIFFERENTIAL/PLATELET - Abnormal; Notable for the following components:      Result Value   Hemoglobin 10.3 (*)    HCT 34.4 (*)    MCV 72.9 (*)    MCH 21.8 (*)    MCHC 29.9 (*)    RDW 16.2 (*)    All other components within normal limits  RESP PANEL BY RT-PCR (RSV, FLU A&B, COVID)  RVPGX2  PREGNANCY, URINE  D-DIMER, QUANTITATIVE  BASIC METABOLIC PANEL WITH GFR  TROPONIN T, HIGH SENSITIVITY                                                                                                                          Radiology DG Chest 2 View Result Date: 01/11/2024 CLINICAL DATA:  Shortness of breath.  Chest wall pain. EXAM: CHEST - 2 VIEW COMPARISON:  07/07/2023 FINDINGS: The heart size and mediastinal contours are within normal limits. Both lungs are clear. The visualized skeletal structures are unremarkable. IMPRESSION: No active cardiopulmonary disease. Electronically Signed   By: Donnal Fusi M.D.   On: 01/11/2024 10:52    Pertinent labs & imaging results that were available during my care of the patient were reviewed by me and considered in my medical decision making (see MDM for details).  Medications Ordered in ED Medications  albuterol  (VENTOLIN  HFA) 108 (90 Base) MCG/ACT inhaler 2 puff (has no administration in time range)  dexamethasone  (DECADRON ) tablet 4 mg (has  no administration in time range)  Procedures Procedures  (including critical care time)  Medical Decision Making / ED Course   This patient presents to the ED for concern of shortness of breath, cough, chest pain and hemoptysis, this involves an extensive number of treatment options, and is a complaint that carries with it a high risk of complications and morbidity.  The differential diagnosis includes bronchitis, PE.  MDM: Patient tachycardic on arrival, however this normalized when she was in the room.  Cannot exclude PERC criteria.  Will obtain a D-dimer on the patient.  Chest x-ray, per my independent review, shows no pneumonia.  Believe this is likely bronchitis but will work patient up for PE, ACS.  Reassessment 11:55 AM-negative D-dimer.  Negative troponin.  Chest x-ray clear.  My independent review the patient's chest x-ray shows no pneumonia.  My independent review of the patient's EKG shows no ST segment depressions or elevations, no evidence of acute ischemia.  Will give the patient a single dose of Decadron , and albuterol  inhaler.  Will discharge.    Lab Tests: -I ordered, reviewed, and interpreted labs.   The pertinent results include:   Labs Reviewed  CBC WITH DIFFERENTIAL/PLATELET - Abnormal; Notable for the following components:      Result Value   Hemoglobin 10.3 (*)    HCT 34.4 (*)    MCV 72.9 (*)    MCH 21.8 (*)    MCHC 29.9 (*)    RDW 16.2 (*)    All other components within normal limits  RESP PANEL BY RT-PCR (RSV, FLU A&B, COVID)  RVPGX2  PREGNANCY, URINE  D-DIMER, QUANTITATIVE  BASIC METABOLIC PANEL WITH GFR  TROPONIN T, HIGH SENSITIVITY      EKG my independent review of the patient's EKG shows no ST segment depressions or elevations, no T wave inversions, no evidence of acute ischemia.  EKG  Interpretation Date/Time:  Monday Jan 11 2024 11:36:02 EDT Ventricular Rate:  95 PR Interval:  140 QRS Duration:  71 QT Interval:  343 QTC Calculation: 432 R Axis:   35  Text Interpretation: Sinus rhythm Probable left atrial enlargement Low voltage, precordial leads Confirmed by Afton Horse [phone]) on 01/11/2024 11:39:35 AM         Imaging Studies ordered: I ordered imaging studies including chest x-ray I independently visualized and interpreted imaging. I agree with the radiologist interpretation   Medicines ordered and prescription drug management: Meds ordered this encounter  Medications   albuterol  (VENTOLIN  HFA) 108 (90 Base) MCG/ACT inhaler 2 puff   dexamethasone  (DECADRON ) tablet 4 mg    -I have reviewed the patients home medicines and have made adjustments as needed    Cardiac Monitoring: The patient was maintained on a cardiac monitor.  I personally viewed and interpreted the cardiac monitored which showed an underlying rhythm of: Normal sinus rhythm  Social Determinants of Health:  Factors impacting patients care include: Lack of access to primary care   Reevaluation: After the interventions noted above, I reevaluated the patient and found that they have :improved  Co morbidities that complicate the patient evaluation  Past Medical History:  Diagnosis Date   Allergy     Asthma    Eczema    GERD (gastroesophageal reflux disease)    Migraine    Prediabetes    Shellfish allergy        Dispostion: I considered admission for this patient, however she is appropriate for outpatient follow-up     Final Clinical Impression(s) / ED Diagnoses Final diagnoses:  Bronchitis     @  [facility] Dean    Afton Horse T, DO 01/11/24 1201

## 2024-01-12 ENCOUNTER — Telehealth: Payer: Self-pay | Admitting: Family Medicine

## 2024-01-12 MED ORDER — ALBUTEROL SULFATE (2.5 MG/3ML) 0.083% IN NEBU: 2.5000 mg | INHALATION_SOLUTION | RESPIRATORY_TRACT | 1 refills | Status: AC | PRN

## 2024-01-12 NOTE — Telephone Encounter (Signed)
 Spoke with patient--DOB verified--informed her that nebulizer solution would be sent in. Pharmacy confirmed. Verbalized understanding.

## 2024-01-12 NOTE — Telephone Encounter (Signed)
 Jean Frank called and stated that she has been to the hospital, and was diagnosed with Bronchitis,and that at the hospital they instructed her to  reach out to her allergy  and asthma doctor to see if she could get more liquid medication for her nebulizer. She is requesting a nurse to call he back to discuss.

## 2024-02-08 ENCOUNTER — Other Ambulatory Visit: Payer: Self-pay | Admitting: Nurse Practitioner

## 2024-03-16 ENCOUNTER — Telehealth: Admitting: Physician Assistant

## 2024-03-16 DIAGNOSIS — R3989 Other symptoms and signs involving the genitourinary system: Secondary | ICD-10-CM | POA: Diagnosis not present

## 2024-03-16 MED ORDER — CEPHALEXIN 500 MG PO CAPS: 500.0000 mg | ORAL_CAPSULE | Freq: Two times a day (BID) | ORAL | 0 refills | Status: AC

## 2024-03-16 NOTE — Patient Instructions (Addendum)
 Jean Frank, thank you for joining Jean Frank, Jean Frank for today's virtual visit.  While this provider is not your primary care provider (PCP), if your PCP is located in our provider database this encounter information will be shared with them immediately following your visit.   A [facility] MyChart account gives you access to today's visit and all your visits, tests, and labs performed at [facility]  click here if you don't have a [facility] MyChart account or go to [email or web address]  Consent: (Patient) Jean Frank provided verbal consent for this virtual visit at the beginning of the encounter.  Current Medications:  Current Outpatient Medications:    albuterol  (PROVENTIL  HFA) 108 (90 Base) MCG/ACT inhaler, Inhale 2 puffs into the lungs every 4 (four) hours as needed for wheezing or shortness of breath., Disp: 18 g, Rfl: 1   albuterol  (PROVENTIL ) (2.5 MG/3ML) 0.083% nebulizer solution, Take 3 mLs (2.5 mg total) by nebulization every 4 (four) hours as needed for wheezing or shortness of breath., Disp: 75 mL, Rfl: 1   amoxicillin -clavulanate (AUGMENTIN ) 400-57 MG/5ML suspension, Take 10 mL by mouth twice daily for 7 day., Disp: 140 mL, Rfl: 0   azelastine  (ASTELIN ) 0.1 % nasal spray, 2 sprays each nostril 1-2 times daily prn. ., Disp: 30 mL, Rfl: 5   cetirizine  (ZYRTEC ) 10 MG tablet, Take 1 tablet (10 mg total) by mouth daily., Disp: 30 tablet, Rfl: 5   cromolyn  (OPTICROM ) 4 % ophthalmic solution, Place 2 drops into both eyes 4 (four) times daily., Disp: 10 mL, Rfl: 5   desonide  (DESOWEN ) 0.05 % cream, Apply topically 2 (two) times daily as needed., Disp: 30 g, Rfl: 5   desonide  (DESOWEN ) 0.05 % ointment, Apply 1 Application topically 2 (two) times daily., Disp: 15 g, Rfl: 2   doxycycline  (VIBRAMYCIN ) 100 MG capsule, Take 1 capsule (100 mg total) by mouth 2 (two) times daily., Disp: 14 capsule, Rfl: 0   EPIPEN  2-PAK 0.3 MG/0.3ML SOAJ injection, Inject  0.3 mg into the muscle as needed for anaphylaxis., Disp: 1 each, Rfl: 1   famotidine  (PEPCID ) 20 MG tablet, Take 1 tablet (20 mg total) by mouth 2 (two) times daily as needed for heartburn or indigestion., Disp: 60 tablet, Rfl: 5   fluticasone  (FLONASE ) 50 MCG/ACT nasal spray, Place 1-2 sprays into both nostrils daily., Disp: 16 g, Rfl: 5   Iron , Ferrous Sulfate , 325 (65 Fe) MG TABS, Take 325 mg by mouth daily., Disp: 90 tablet, Rfl: 1   montelukast  (SINGULAIR ) 10 MG tablet, Take 1 tablet (10 mg total) by mouth at bedtime., Disp: 30 tablet, Rfl: 5   NEXIUM  20 MG packet, Take 20 mg by mouth daily before breakfast., Disp: 30 each, Rfl: 12   norethindrone-ethinyl estradiol-FE (LOESTRIN FE 1/20) 1-20 MG-MCG tablet, Take 1 tablet by mouth daily., Disp: 84 tablet, Rfl: 3   ondansetron  (ZOFRAN ) 4 MG tablet, Take 1 tablet (4 mg total) by mouth every 6 (six) hours as needed for nausea or vomiting., Disp: 12 tablet, Rfl: 0   SUMAtriptan  (IMITREX ) 25 MG tablet, Take 1 tablet (25 mg total) by mouth every 2 (two) hours as needed for migraine. May repeat in 2 hours if headache persists or recurs., Disp: 10 tablet, Rfl: 0   SYMBICORT  160-4.5 MCG/ACT inhaler, Inhale 2 puffs into the lungs 2 (two) times daily., Disp: 10.2 g, Rfl: 5   triamcinolone  cream (KENALOG ) 0.1 %, Apply 1 Application topically 2 (two) times daily., Disp: 30 g, Rfl: 0   triamcinolone   ointment (KENALOG ) 0.1 %, Apply 1 Application topically 2 (two) times daily as needed., Disp: 80 g, Rfl: 5   VENTOLIN  HFA 108 (90 Base) MCG/ACT inhaler, Inhale 2 puffs into the lungs every 4 (four) hours as needed for wheezing or shortness of breath., Disp: 18 g, Rfl: 2   Medications ordered in this encounter:  No orders of the defined types were placed in this encounter.    *If you need refills on other medications prior to your next appointment, please contact your pharmacy*  Follow-Up: Call back or seek an in-person evaluation if the symptoms worsen or if  the condition fails to improve as anticipated.  [facility] [phone]  Other Instructions Your symptoms are consistent with a bladder infection, also called acute cystitis. Please take your antibiotic (Keflex ) as directed until all pills are gone.  Stay very well hydrated.  Consider a daily probiotic (Align, Culturelle, or Activia) to help prevent stomach upset caused by the antibiotic.  Taking a probiotic daily may also help prevent recurrent UTIs.  Also consider taking AZO (Phenazopyridine) tablets to help decrease pain with urination.    Urinary Tract Infection A urinary tract infection (UTI) can occur any place along the urinary tract. The tract includes the kidneys, ureters, bladder, and urethra. A type of germ called bacteria often causes a UTI. UTIs are often helped with antibiotic medicine.  HOME CARE  If given, take antibiotics as told by your doctor. Finish them even if you start to feel better. Drink enough fluids to keep your pee (urine) clear or pale yellow. Avoid tea, drinks with caffeine, and bubbly (carbonated) drinks. Pee often. Avoid holding your pee in for a long time. Pee before and after having sex (intercourse). Wipe from front to back after you poop (bowel movement) if you are a woman. Use each tissue only once. GET HELP RIGHT AWAY IF:  You have back pain. You have lower belly (abdominal) pain. You have chills. You feel sick to your stomach (nauseous). You throw up (vomit). Your burning or discomfort with peeing does not go away. You have a fever. Your symptoms are not better in 3 days. MAKE SURE YOU:  Understand these instructions. Will watch your condition. Will get help right away if you are not doing well or get worse. Document Released: 01/21/2008 Document Revised: 04/28/2012 Document Reviewed: 03/04/2012 [facility] Patient Information 2015 [place], [place]. This information is not intended to replace advice given to you by your health care  provider. Make sure you discuss any questions you have with your health care provider.    If you have been instructed to have an in-person evaluation today at a local Urgent Care facility, please use the link below. It will take you to a list of all of our available [facility], including address, phone number and hours of operation. Please do not delay care.  [facility]  If you or a family member do not have a primary care provider, use the link below to schedule a visit and establish care. When you choose a [facility] primary care physician or advanced practice provider, you gain a long-term partner in health. Find a Primary Care Provider  Learn more about [facility]'s in-office and virtual care options: [facility] Now

## 2024-03-16 NOTE — Progress Notes (Signed)
 Virtual Visit Consent   Jean Frank, you are scheduled for a virtual visit with a [facility] provider today. Just as with appointments in the office, your consent must be obtained to participate. Your consent will be active for this visit and any virtual visit you may have with one of our providers in the next 365 days. If you have a MyChart account, a copy of this consent can be sent to you electronically.  As this is a virtual visit, video technology does not allow for your provider to perform a traditional examination. This may limit your provider's ability to fully assess your condition. If your provider identifies any concerns that need to be evaluated in person or the need to arrange testing (such as labs, EKG, etc.), we will make arrangements to do so. Although advances in technology are sophisticated, we cannot ensure that it will always work on either your end or our end. If the connection with a video visit is poor, the visit may have to be switched to a telephone visit. With either a video or telephone visit, we are not always able to ensure that we have a secure connection.  By engaging in this virtual visit, you consent to the provision of healthcare and authorize for your insurance to be billed (if applicable) for the services provided during this visit. Depending on your insurance coverage, you may receive a charge related to this service.  I need to obtain your verbal consent now. Are you willing to proceed with your visit today? Jean Frank has provided verbal consent on 03/16/2024 for a virtual visit (video or telephone). Jean Frank, [place]  Date: 03/16/2024 2:06 PM   Virtual Visit via Video Note   I, Jean Frank, connected with  Jean Frank  ([id number], August 23, 2000) on 03/16/24 at  2:00 PM EDT by a video-enabled telemedicine application and verified that I am speaking with the correct person using two identifiers.  Location: Patient: Virtual Visit  Location Patient: Home Provider: Virtual Visit Location Provider: Home Office   I discussed the limitations of evaluation and management by telemedicine and the availability of in person appointments. The patient expressed understanding and agreed to proceed.    History of Present Illness: Jean Frank is a 23 y.o. who identifies as a female who was assigned female at birth, and is being seen today for suspected UTI. Endorses symptoms starting   Dysuria, urgency, frequency, No chills, fever.  Suprapubic pain and pressure.  Denies vaginal discharge.  LMP -- 1.5 weeks ago.   HPI: HPI  Problems:  Patient Active Problem List   Diagnosis Date Noted   Epistaxis 05/04/2023   Angio-edema 05/04/2023   Menorrhagia with irregular cycle 01/15/2023   Elevated blood pressure reading 04/16/2022   Anemia 03/19/2022   Breast pain 02/12/2022   Chronic midline thoracic back pain 12/18/2021   Prediabetes 12/18/2021   Not well controlled moderate persistent asthma 06/21/2021   Seasonal and perennial allergic rhinitis 06/21/2021   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   Seasonal allergic conjunctivitis 04/19/2021   Intrinsic atopic dermatitis 04/19/2021   Migraine without aura and without status migrainosus, not intractable 11/01/2019   Asthma 08/17/2019   Chronic idiopathic urticaria 08/17/2019   Gastroesophageal reflux disease 07/20/2017   Allergic rhinitis 07/30/2016   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    Allergies:  Allergies  Allergen Reactions   Shellfish Allergy     Medications:  Current Outpatient Medications:    albuterol  (PROVENTIL  HFA) 108 (90 Base) MCG/ACT inhaler, Inhale  2 puffs into the lungs every 4 (four) hours as needed for wheezing or shortness of breath., Disp: 18 g, Rfl: 1   albuterol  (PROVENTIL ) (2.5 MG/3ML) 0.083% nebulizer solution, Take 3 mLs (2.5 mg total) by nebulization every 4 (four) hours as needed for wheezing or shortness of breath., Disp: 75 mL, Rfl: 1    amoxicillin -clavulanate (AUGMENTIN ) 400-57 MG/5ML suspension, Take 10 mL by mouth twice daily for 7 day., Disp: 140 mL, Rfl: 0   azelastine  (ASTELIN ) 0.1 % nasal spray, 2 sprays each nostril 1-2 times daily prn. ., Disp: 30 mL, Rfl: 5   cetirizine  (ZYRTEC ) 10 MG tablet, Take 1 tablet (10 mg total) by mouth daily., Disp: 30 tablet, Rfl: 5   cromolyn  (OPTICROM ) 4 % ophthalmic solution, Place 2 drops into both eyes 4 (four) times daily., Disp: 10 mL, Rfl: 5   desonide  (DESOWEN ) 0.05 % cream, Apply topically 2 (two) times daily as needed., Disp: 30 g, Rfl: 5   desonide  (DESOWEN ) 0.05 % ointment, Apply 1 Application topically 2 (two) times daily., Disp: 15 g, Rfl: 2   doxycycline  (VIBRAMYCIN ) 100 MG capsule, Take 1 capsule (100 mg total) by mouth 2 (two) times daily., Disp: 14 capsule, Rfl: 0   EPIPEN  2-PAK 0.3 MG/0.3ML SOAJ injection, Inject 0.3 mg into the muscle as needed for anaphylaxis., Disp: 1 each, Rfl: 1   famotidine  (PEPCID ) 20 MG tablet, Take 1 tablet (20 mg total) by mouth 2 (two) times daily as needed for heartburn or indigestion., Disp: 60 tablet, Rfl: 5   fluticasone  (FLONASE ) 50 MCG/ACT nasal spray, Place 1-2 sprays into both nostrils daily., Disp: 16 g, Rfl: 5   Iron , Ferrous Sulfate , 325 (65 Fe) MG TABS, Take 325 mg by mouth daily., Disp: 90 tablet, Rfl: 1   montelukast  (SINGULAIR ) 10 MG tablet, Take 1 tablet (10 mg total) by mouth at bedtime., Disp: 30 tablet, Rfl: 5   NEXIUM  20 MG packet, Take 20 mg by mouth daily before breakfast., Disp: 30 each, Rfl: 12   norethindrone-ethinyl estradiol-FE (LOESTRIN FE 1/20) 1-20 MG-MCG tablet, Take 1 tablet by mouth daily., Disp: 84 tablet, Rfl: 3   ondansetron  (ZOFRAN ) 4 MG tablet, Take 1 tablet (4 mg total) by mouth every 6 (six) hours as needed for nausea or vomiting., Disp: 12 tablet, Rfl: 0   SUMAtriptan  (IMITREX ) 25 MG tablet, Take 1 tablet (25 mg total) by mouth every 2 (two) hours as needed for migraine. May repeat in 2 hours if headache  persists or recurs., Disp: 10 tablet, Rfl: 0   SYMBICORT  160-4.5 MCG/ACT inhaler, Inhale 2 puffs into the lungs 2 (two) times daily., Disp: 10.2 g, Rfl: 5   triamcinolone  cream (KENALOG ) 0.1 %, Apply 1 Application topically 2 (two) times daily., Disp: 30 g, Rfl: 0   triamcinolone  ointment (KENALOG ) 0.1 %, Apply 1 Application topically 2 (two) times daily as needed., Disp: 80 g, Rfl: 5   VENTOLIN  HFA 108 (90 Base) MCG/ACT inhaler, Inhale 2 puffs into the lungs every 4 (four) hours as needed for wheezing or shortness of breath., Disp: 18 g, Rfl: 2  Observations/Objective: Patient is well-developed, well-nourished in no acute distress.  Resting comfortably at home.  Head is normocephalic, atraumatic.  No labored breathing. Speech is clear and coherent with logical content.  Patient is alert and oriented at baseline.   Assessment and Plan: There are no diagnoses linked to this encounter. Classic UTI symptoms with absence of alarm signs or symptoms. Prior history of UTI. Will treat empirically with  Keflex  for suspected uncomplicated cystitis. Supportive measures and OTC medications reviewed. Strict in-person evaluation precautions discussed.    Follow Up Instructions: I discussed the assessment and treatment plan with the patient. The patient was provided an opportunity to ask questions and all were answered. The patient agreed with the plan and demonstrated an understanding of the instructions.  A copy of instructions were sent to the patient via MyChart unless otherwise noted below.   The patient was advised to call back or seek an in-person evaluation if the symptoms worsen or if the condition fails to improve as anticipated.    Jean Velma Lunger, PA-C

## 2024-03-27 ENCOUNTER — Encounter

## 2024-03-27 ENCOUNTER — Telehealth: Admitting: Physician Assistant

## 2024-03-27 DIAGNOSIS — U071 COVID-19: Secondary | ICD-10-CM

## 2024-03-28 MED ORDER — PROMETHAZINE-DM 6.25-15 MG/5ML PO SYRP: 5.0000 mL | ORAL_SOLUTION | Freq: Four times a day (QID) | ORAL | 0 refills | Status: DC | PRN

## 2024-03-28 MED ORDER — NAPROXEN 500 MG PO TABS: 500.0000 mg | ORAL_TABLET | Freq: Two times a day (BID) | ORAL | 0 refills | Status: AC

## 2024-03-28 MED ORDER — FLUTICASONE PROPIONATE 50 MCG/ACT NA SUSP: 2.0000 | Freq: Every day | NASAL | 0 refills | Status: AC

## 2024-03-28 NOTE — Progress Notes (Signed)
 E-Visit  for Positive Covid Test Result   We are sorry you are not feeling well. We are here to help!  You have tested positive for COVID-19, meaning that you were infected with the novel coronavirus and could give the virus to others.  Most people with COVID-19 have mild illness and can recover at home without medical care. Do not leave your home, except to get medical care. Do not visit public areas and do not go to places where you are unable to wear a mask. It is important that you stay home  to take care for yourself and to help protect other people in your home and community.      Isolation Instructions:   You are to isolate at home until you have been fever free for at least 24 hours without a fever-reducing medication, and symptoms have been steadily improving for 24 hours. At that time,  you can end isolation but need to mask for an additional 5 days.  If you must be around other household members who do not have symptoms, you need to make sure that both you and the family members are masking consistently with a high-quality mask.  If you note any worsening of symptoms despite treatment, please seek an in-person evaluation ASAP. If you note any significant shortness of breath or any chest pain, please seek ER evaluation. Please do not delay care!   Go to the nearest hospital ED for assessment if fever/cough/breathlessness are severe or illness seems like a threat to life.    The following symptoms may appear 2-14 days after exposure: Fever Cough Shortness of breath or difficulty breathing Chills Repeated shaking with chills Muscle pain Headache Sore throat New loss of taste or smell Fatigue Congestion or runny nose Nausea or vomiting Diarrhea  You can use medication such as I have prescribed Phenergan  DM 6.25 mg/15 mg. You make take one teaspoon / 5 ml every 4-6 hours as needed for cough, I have prescribed an anti-inflammatory - Naprosyn  500 mg. Take twice daily as needed  for fever or body aches for 2 weeks, and I have prescribed Fluticasone  nasal spray 2 sprays in each nostril one time per dayasal spray 2 sprays in each nostril one time per day  You may also take acetaminophen  (Tylenol ) as needed for fever.  A work note has been provided for you today. It will be available under letters in your MyChart account.  HOME CARE: Only take medications as instructed by your medical team. Drink plenty of fluids and get plenty of rest. A steam or ultrasonic humidifier can help if you have congestion.   GET HELP RIGHT AWAY IF YOU HAVE EMERGENCY WARNING SIGNS.  Call 911 or proceed to your closest emergency facility if: You develop worsening high fever. Trouble breathing Bluish lips or face Persistent pain or pressure in the chest New confusion Inability to wake or stay awake You cough up blood. Your symptoms become more severe Inability to hold down food or fluids  This list is not all possible symptoms. Contact your medical provider for any symptoms that are severe or concerning to you.   Your e-visit answers were reviewed by a board certified advanced clinical practitioner to complete your personal care plan.  Depending on the condition, your plan could have included both over the counter or prescription medications.  If there is a problem please reply once you have received a response from your provider.  Your safety is important to us .  If you  have drug allergies check your prescription carefully.    You can use MyChart to ask questions about today's visit, request a non-urgent call back, or ask for a work or school excuse for 24 hours related to this e-Visit. If it has been greater than 24 hours you will need to follow up with your provider, or enter a new e-Visit to address those concerns. You will get an e-mail in the next two days asking about your experience.  I hope that your e-visit has been valuable and will speed your recovery. Thank you for using  e-visits.     I have spent 5 minutes in review of e-visit questionnaire, review and updating patient chart, medical decision making and response to patient.   Delon CHRISTELLA Dickinson, PA-C

## 2024-04-01 ENCOUNTER — Telehealth: Admitting: Physician Assistant

## 2024-04-01 DIAGNOSIS — U071 COVID-19: Secondary | ICD-10-CM

## 2024-04-01 NOTE — Progress Notes (Signed)
 Patient just asking if she can return to work post covid.  She has been fever-free and is almost 100% better.   Advised she can return.   Will no charge visit.

## 2024-05-04 ENCOUNTER — Other Ambulatory Visit: Payer: Self-pay | Admitting: Family Medicine

## 2024-05-04 DIAGNOSIS — J302 Other seasonal allergic rhinitis: Secondary | ICD-10-CM

## 2024-05-07 ENCOUNTER — Encounter

## 2024-06-16 ENCOUNTER — Ambulatory Visit (HOSPITAL_BASED_OUTPATIENT_CLINIC_OR_DEPARTMENT_OTHER): Admitting: Certified Nurse Midwife

## 2024-06-30 ENCOUNTER — Telehealth: Payer: Self-pay | Admitting: Family Medicine

## 2024-06-30 ENCOUNTER — Encounter

## 2024-06-30 DIAGNOSIS — N760 Acute vaginitis: Secondary | ICD-10-CM

## 2024-06-30 DIAGNOSIS — B9689 Other specified bacterial agents as the cause of diseases classified elsewhere: Secondary | ICD-10-CM

## 2024-06-30 MED ORDER — METRONIDAZOLE 500 MG PO TABS: 500.0000 mg | ORAL_TABLET | Freq: Two times a day (BID) | ORAL | 0 refills | Status: AC

## 2024-06-30 NOTE — Progress Notes (Signed)
 Virtual Visit Consent   [name], you are scheduled for a virtual visit with a [facility] provider today. Just as with appointments in the office, your consent must be obtained to participate. Your consent will be active for this visit and any virtual visit you may have with one of our providers in the next 365 days. If you have a MyChart account, a copy of this consent can be sent to you electronically.  As this is a virtual visit, video technology does not allow for your provider to perform a traditional examination. This may limit your provider's ability to fully assess your condition. If your provider identifies any concerns that need to be evaluated in person or the need to arrange testing (such as labs, EKG, etc.), we will make arrangements to do so. Although advances in technology are sophisticated, we cannot ensure that it will always work on either your end or our end. If the connection with a video visit is poor, the visit may have to be switched to a telephone visit. With either a video or telephone visit, we are not always able to ensure that we have a secure connection.  By engaging in this virtual visit, you consent to the provision of healthcare and authorize for your insurance to be billed (if applicable) for the services provided during this visit. Depending on your insurance coverage, you may receive a charge related to this service.  I need to obtain your verbal consent now. Are you willing to proceed with your visit today? Ada Holness has provided verbal consent on 06/30/2024 for a virtual visit (video or telephone). [name], FNP  Date: 06/30/2024 3:34 PM   Virtual Visit via Video Note   I, [name], connected with  Arlo Buffone  ([id number], 03-14-2001) on 06/30/24 at  3:30 PM EST by a video-enabled telemedicine application and verified that I am speaking with the correct person using two identifiers.  Location: Patient: Virtual Visit Location  Patient: Home Provider: Virtual Visit Location Provider: Home Office   I discussed the limitations of evaluation and management by telemedicine and the availability of in person appointments. The patient expressed understanding and agreed to proceed.    History of Present Illness: Jean Frank is a 23 y.o. who identifies as a female who was assigned female at birth, and is being seen today for vaginal irritation with odor after using a new body wash with boric acid 2 nights ago. No itching or thick discharge just normal DC for her she says, .  HPI: HPI  Problems:  Patient Active Problem List   Diagnosis Date Noted   Epistaxis 05/04/2023   Angio-edema 05/04/2023   Menorrhagia with irregular cycle 01/15/2023   Elevated blood pressure reading 04/16/2022   Anemia 03/19/2022   Breast pain 02/12/2022   Chronic midline thoracic back pain 12/18/2021   Prediabetes 12/18/2021   Not well controlled moderate persistent asthma 06/21/2021   Seasonal and perennial allergic rhinitis 06/21/2021   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   Seasonal allergic conjunctivitis 04/19/2021   Intrinsic atopic dermatitis 04/19/2021   Migraine without aura and without status migrainosus, not intractable 11/01/2019   Asthma 08/17/2019   Chronic idiopathic urticaria 08/17/2019   Gastroesophageal reflux disease 07/20/2017   Allergic rhinitis 07/30/2016   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    Allergies:  Allergies  Allergen Reactions   Shellfish Allergy     Medications:  Current Outpatient Medications:    metroNIDAZOLE (FLAGYL) 500 MG tablet, Take 1 tablet (500 mg total)  by mouth 2 (two) times daily for 7 days., Disp: 14 tablet, Rfl: 0   albuterol  (PROVENTIL  HFA) 108 (90 Base) MCG/ACT inhaler, Inhale 2 puffs into the lungs every 4 (four) hours as needed for wheezing or shortness of breath., Disp: 18 g, Rfl: 1   albuterol  (PROVENTIL ) (2.5 MG/3ML) 0.083% nebulizer solution, Take 3 mLs (2.5 mg total) by  nebulization every 4 (four) hours as needed for wheezing or shortness of breath., Disp: 75 mL, Rfl: 1   azelastine  (ASTELIN ) 0.1 % nasal spray, 2 sprays each nostril 1-2 times daily prn. ., Disp: 30 mL, Rfl: 5   cetirizine  (ZYRTEC ) 10 MG tablet, Take 1 tablet (10 mg total) by mouth daily., Disp: 30 tablet, Rfl: 5   EPINEPHRINE  0.3 mg/0.3 mL IJ SOAJ injection, INJECT 0.3 MG INTO THE MUSCLE AS NEEDED FOR ANAPHYLAXIS., Disp: 2 each, Rfl: 1   famotidine  (PEPCID ) 20 MG tablet, Take 1 tablet (20 mg total) by mouth 2 (two) times daily as needed for heartburn or indigestion., Disp: 60 tablet, Rfl: 5   fluticasone  (FLONASE ) 50 MCG/ACT nasal spray, Place 2 sprays into both nostrils daily., Disp: 16 g, Rfl: 0   Iron , Ferrous Sulfate , 325 (65 Fe) MG TABS, Take 325 mg by mouth daily., Disp: 90 tablet, Rfl: 1   montelukast  (SINGULAIR ) 10 MG tablet, Take 1 tablet (10 mg total) by mouth at bedtime., Disp: 30 tablet, Rfl: 5   naproxen  (NAPROSYN ) 500 MG tablet, Take 1 tablet (500 mg total) by mouth 2 (two) times daily with a meal., Disp: 30 tablet, Rfl: 0   NEXIUM  20 MG packet, Take 20 mg by mouth daily before breakfast., Disp: 30 each, Rfl: 12   norethindrone-ethinyl estradiol-FE (LOESTRIN FE 1/20) 1-20 MG-MCG tablet, Take 1 tablet by mouth daily., Disp: 84 tablet, Rfl: 3   promethazine -dextromethorphan (PROMETHAZINE -DM) 6.25-15 MG/5ML syrup, Take 5 mLs by mouth 4 (four) times daily as needed., Disp: 118 mL, Rfl: 0   SUMAtriptan  (IMITREX ) 25 MG tablet, Take 1 tablet (25 mg total) by mouth every 2 (two) hours as needed for migraine. May repeat in 2 hours if headache persists or recurs., Disp: 10 tablet, Rfl: 0   SYMBICORT  160-4.5 MCG/ACT inhaler, Inhale 2 puffs into the lungs 2 (two) times daily., Disp: 10.2 g, Rfl: 5   triamcinolone  cream (KENALOG ) 0.1 %, Apply 1 Application topically 2 (two) times daily., Disp: 30 g, Rfl: 0   triamcinolone  ointment (KENALOG ) 0.1 %, Apply 1 Application topically 2 (two) times daily as  needed., Disp: 80 g, Rfl: 5  Observations/Objective: Patient is well-developed, well-nourished in no acute distress.  Resting comfortably  at home.  Head is normocephalic, atraumatic.  No labored breathing.  Speech is clear and coherent with logical content.  Patient is alert and oriented at baseline.    Assessment and Plan: 1. Bacterial vaginosis (Primary)  UC as needed.   Follow Up Instructions: I discussed the assessment and treatment plan with the patient. The patient was provided an opportunity to ask questions and all were answered. The patient agreed with the plan and demonstrated an understanding of the instructions.  A copy of instructions were sent to the patient via MyChart unless otherwise noted below.     The patient was advised to call back or seek an in-person evaluation if the symptoms worsen or if the condition fails to improve as anticipated.    Quintina Hakeem, FNP

## 2024-06-30 NOTE — Patient Instructions (Signed)

## 2024-08-16 ENCOUNTER — Encounter (HOSPITAL_BASED_OUTPATIENT_CLINIC_OR_DEPARTMENT_OTHER): Payer: Self-pay | Admitting: Obstetrics and Gynecology

## 2024-08-18 ENCOUNTER — Telehealth: Payer: Self-pay | Admitting: Physician Assistant

## 2024-08-18 DIAGNOSIS — J069 Acute upper respiratory infection, unspecified: Secondary | ICD-10-CM

## 2024-08-18 MED ORDER — AZELASTINE HCL 0.1 % NA SOLN: 1.0000 | Freq: Two times a day (BID) | NASAL | 0 refills | Status: AC

## 2024-08-18 NOTE — Progress Notes (Signed)

## 2024-08-22 ENCOUNTER — Telehealth: Payer: Self-pay | Admitting: Physician Assistant

## 2024-08-22 DIAGNOSIS — J069 Acute upper respiratory infection, unspecified: Secondary | ICD-10-CM

## 2024-08-22 MED ORDER — LIDOCAINE VISCOUS HCL 2 % MT SOLN: 5.0000 mL | Freq: Four times a day (QID) | OROMUCOSAL | 0 refills | Status: AC | PRN

## 2024-08-22 MED ORDER — PSEUDOEPH-BROMPHEN-DM 30-2-10 MG/5ML PO SYRP: 5.0000 mL | ORAL_SOLUTION | Freq: Four times a day (QID) | ORAL | 0 refills | Status: AC | PRN

## 2024-08-22 NOTE — Progress Notes (Signed)
 We are sorry you are not feeling well.  Here is how we plan to help!  Based on what you have shared with me, it looks like you may have a viral upper respiratory infection.  Upper respiratory infections are caused by a large number of viruses; however, rhinovirus is the most common cause.   Symptoms vary from person to person, with common symptoms including sore throat, cough, and fatigue or lack of energy, and a feeling of general discomfort.  A low-grade fever of up to 100.4 may present, but is often uncommon.  Symptoms vary however, and are closely related to a person's age or underlying illnesses.  The most common symptoms associated with an upper respiratory infection are nasal discharge or congestion, cough, sneezing, headache and pressure in the ears and face.  These symptoms usually persist for about 3 to 10 days, but can last up to 2 weeks.  It is important to know that upper respiratory infections do not cause serious illness or complications in most cases.    Upper respiratory infections can be transmitted from person to person, with the most common method of transmission being a person's hands.  The virus is able to live on the skin and can infect other persons for up to 2 hours after direct contact.  Also, these can be transmitted when someone coughs or sneezes; thus, it is important to cover the mouth to reduce this risk.  To keep the spread of the illness at bay, good hand hygiene is very important!  Because this is a viral infection, there are no specific treatments other than to help you with the symptoms until the infection runs its course.    For nasal congestion, you may use an oral decongestants such as Mucinex D or if you have glaucoma or high blood pressure use plain Mucinex.  Saline nasal spray or nasal drops can help and can safely be used as often as needed for congestion.   If you do not have a history of heart disease, hypertension, diabetes or thyroid disease,  prostate/bladder issues or glaucoma, you may also use Sudafed to treat nasal congestion.  It is highly recommended that you consult with a pharmacist or your primary care physician to ensure this medication is safe for you to take.     If you have a cough, you may use over-the-counter cough suppressants such as Delsym and Robitussin.  If you have glaucoma or high blood pressure, you can also use Coricidin HBP.   For cough I have prescribed for you A prescription cough medication called Bromfed-DM 2-30-10 mg/5 mL.  Take 5 mL every 6 hours as needed for cough.  If you have a sore or scratchy throat, use a saltwater gargle-  to  teaspoon of salt dissolved in a 4-ounce to 8-ounce glass of warm water.  Gargle the solution for approximately 15-30 seconds and then spit.  It is important not to swallow the solution.  You can also use throat lozenges/cough drops and Chloraseptic spray to help with throat pain or discomfort.  Warm or cold liquids can also be helpful in relieving throat pain. I have also prescribed I have prescribed a Viscous Lidocaine 2% solution. Swallow 5-10 mL every 4-6 hours as needed for sore throat. DO NOT eat or drink anything for 15-20 minutes after swallowing to allow the medication to coat the throat.  For headache, pain, or general discomfort, you can use Ibuprofen  or Tylenol  as directed.   Some authorities believe that zinc  sprays or the use of Echinacea may shorten the course of your symptoms.   HOME CARE Only take medications as instructed by your medical team. Be sure to drink plenty of fluids. Water is fine as well as fruit juices, sodas and electrolyte beverages. You may want to stay away from caffeine or alcohol. If you are nauseated, try taking small sips of liquids. How do you know if you are getting enough fluid? Your urine should be a pale yellow or almost colorless. Get rest. Taking a steamy shower or using a humidifier may help nasal congestion and ease sore throat  pain. You can place a towel over your head and breathe in the steam from hot water coming from a faucet. Using a saline nasal spray works much the same way. Cough drops, hard candies and sore throat lozenges may ease your cough. Avoid close contacts especially the very young and the elderly Cover your mouth if you cough or sneeze Always remember to wash your hands.   GET HELP RIGHT AWAY IF: You develop worsening fever. If your symptoms do not improve within 10 days You develop yellow or green discharge from your nose over 3 days. You have coughing fits You develop a severe head ache or visual changes. You develop shortness of breath, difficulty breathing or start having chest pain Your symptoms persist after you have completed your treatment plan  MAKE SURE YOU  Understand these instructions. Will watch your condition. Will get help right away if you are not doing well or get worse.  Your e-visit answers were reviewed by a board certified advanced clinical practitioner to complete your personal care plan. Depending upon the condition, your plan could have included both over-the-counter or prescription medications.  Please review your pharmacy choice. If there is a problem, you may call our nursing hot line at and have the prescription routed to another pharmacy. Your safety is important to us . If you have drug allergies, check your prescription carefully.   You can use MyChart to ask questions about today's visit, request a non-urgent call back, or ask for a work or school excuse for 24 hours related to this e-Visit. If it has been greater than 24 hours you will need to follow up with your provider, or enter a new e-Visit to address those concerns. You will get an e-mail in the next two days asking about your experience.  I hope that your e-visit has been valuable and will speed your recovery. Thank you for using e-visits.   I have spent 5 minutes in review of e-visit questionnaire,  review and updating patient chart, medical decision making and response to patient.   Delon CHRISTELLA Dickinson, PA-C
